# Patient Record
Sex: Female | Born: 1960 | Race: White | Hispanic: No | Marital: Married | Smoking: Former smoker
Health system: Southern US, Community
[De-identification: ages and names within clinical notes are randomized; demographics above are authoritative.]

## PROBLEM LIST (undated history)

## (undated) DIAGNOSIS — K219 Gastro-esophageal reflux disease without esophagitis: Secondary | ICD-10-CM

## (undated) DIAGNOSIS — D649 Anemia, unspecified: Secondary | ICD-10-CM

## (undated) DIAGNOSIS — I499 Cardiac arrhythmia, unspecified: Secondary | ICD-10-CM

## (undated) DIAGNOSIS — F419 Anxiety disorder, unspecified: Secondary | ICD-10-CM

## (undated) DIAGNOSIS — I10 Essential (primary) hypertension: Secondary | ICD-10-CM

## (undated) DIAGNOSIS — M1990 Unspecified osteoarthritis, unspecified site: Secondary | ICD-10-CM

## (undated) DIAGNOSIS — IMO0001 Reserved for inherently not codable concepts without codable children: Secondary | ICD-10-CM

## (undated) DIAGNOSIS — R51 Headache: Secondary | ICD-10-CM

## (undated) DIAGNOSIS — R011 Cardiac murmur, unspecified: Secondary | ICD-10-CM

## (undated) DIAGNOSIS — J189 Pneumonia, unspecified organism: Secondary | ICD-10-CM

## (undated) DIAGNOSIS — K589 Irritable bowel syndrome without diarrhea: Secondary | ICD-10-CM

## (undated) DIAGNOSIS — I209 Angina pectoris, unspecified: Secondary | ICD-10-CM

## (undated) DIAGNOSIS — Z5189 Encounter for other specified aftercare: Secondary | ICD-10-CM

## (undated) DIAGNOSIS — E7800 Pure hypercholesterolemia, unspecified: Secondary | ICD-10-CM

## (undated) HISTORY — DX: Pure hypercholesterolemia, unspecified: E78.00

## (undated) HISTORY — PX: FOOT ARTHROPLASTY: SHX1657

## (undated) HISTORY — PX: CHOLECYSTECTOMY: SHX55

## (undated) HISTORY — PX: OTHER SURGICAL HISTORY: SHX169

## (undated) HISTORY — PX: BACK SURGERY: SHX140

## (undated) HISTORY — PX: APPENDECTOMY: SHX54

## (undated) HISTORY — PX: ABDOMINAL HYSTERECTOMY: SHX81

---

## 2002-06-03 ENCOUNTER — Encounter: Payer: Self-pay | Admitting: Neurosurgery

## 2002-06-03 ENCOUNTER — Observation Stay (HOSPITAL_COMMUNITY): Admission: RE | Admit: 2002-06-03 | Discharge: 2002-06-04 | Payer: Self-pay | Admitting: Neurosurgery

## 2003-08-12 ENCOUNTER — Ambulatory Visit (HOSPITAL_BASED_OUTPATIENT_CLINIC_OR_DEPARTMENT_OTHER): Admission: RE | Admit: 2003-08-12 | Discharge: 2003-08-12 | Payer: Self-pay | Admitting: Orthopedic Surgery

## 2003-08-12 ENCOUNTER — Ambulatory Visit (HOSPITAL_COMMUNITY): Admission: RE | Admit: 2003-08-12 | Discharge: 2003-08-12 | Payer: Self-pay | Admitting: Orthopedic Surgery

## 2007-05-17 ENCOUNTER — Ambulatory Visit: Payer: Self-pay | Admitting: Cardiology

## 2011-02-22 ENCOUNTER — Encounter (HOSPITAL_COMMUNITY)
Admission: AD | Disposition: A | Discharge status: Home / Self Care | Payer: Self-pay | Source: Other Acute Inpatient Hospital | Attending: Cardiovascular Disease

## 2011-02-22 ENCOUNTER — Observation Stay (HOSPITAL_COMMUNITY)
Admission: AD | Admit: 2011-02-22 | Discharge: 2011-02-23 | DRG: 125 | Disposition: A | Discharge status: Home / Self Care | Payer: BC Managed Care – PPO | Source: Other Acute Inpatient Hospital | Attending: Cardiovascular Disease | Admitting: Cardiovascular Disease

## 2011-02-22 ENCOUNTER — Encounter: Payer: Self-pay | Admitting: Cardiology

## 2011-02-22 ENCOUNTER — Inpatient Hospital Stay (HOSPITAL_COMMUNITY): Payer: BC Managed Care – PPO

## 2011-02-22 ENCOUNTER — Encounter (HOSPITAL_COMMUNITY): Payer: Self-pay | Admitting: General Practice

## 2011-02-22 DIAGNOSIS — R0609 Other forms of dyspnea: Secondary | ICD-10-CM | POA: Insufficient documentation

## 2011-02-22 DIAGNOSIS — R0600 Dyspnea, unspecified: Secondary | ICD-10-CM

## 2011-02-22 DIAGNOSIS — I359 Nonrheumatic aortic valve disorder, unspecified: Secondary | ICD-10-CM | POA: Insufficient documentation

## 2011-02-22 DIAGNOSIS — R079 Chest pain, unspecified: Secondary | ICD-10-CM

## 2011-02-22 DIAGNOSIS — R0789 Other chest pain: Principal | ICD-10-CM | POA: Insufficient documentation

## 2011-02-22 DIAGNOSIS — R0989 Other specified symptoms and signs involving the circulatory and respiratory systems: Secondary | ICD-10-CM | POA: Insufficient documentation

## 2011-02-22 DIAGNOSIS — I10 Essential (primary) hypertension: Secondary | ICD-10-CM | POA: Diagnosis present

## 2011-02-22 HISTORY — DX: Cardiac arrhythmia, unspecified: I49.9

## 2011-02-22 HISTORY — DX: Pneumonia, unspecified organism: J18.9

## 2011-02-22 HISTORY — PX: LEFT HEART CATHETERIZATION WITH CORONARY ANGIOGRAM: SHX5451

## 2011-02-22 HISTORY — DX: Anemia, unspecified: D64.9

## 2011-02-22 HISTORY — DX: Gastro-esophageal reflux disease without esophagitis: K21.9

## 2011-02-22 HISTORY — DX: Headache: R51

## 2011-02-22 HISTORY — DX: Irritable bowel syndrome, unspecified: K58.9

## 2011-02-22 HISTORY — DX: Encounter for other specified aftercare: Z51.89

## 2011-02-22 HISTORY — DX: Unspecified osteoarthritis, unspecified site: M19.90

## 2011-02-22 HISTORY — DX: Essential (primary) hypertension: I10

## 2011-02-22 HISTORY — PX: CARDIAC CATHETERIZATION: SHX172

## 2011-02-22 HISTORY — DX: Angina pectoris, unspecified: I20.9

## 2011-02-22 HISTORY — DX: Reserved for inherently not codable concepts without codable children: IMO0001

## 2011-02-22 HISTORY — DX: Cardiac murmur, unspecified: R01.1

## 2011-02-22 HISTORY — DX: Anxiety disorder, unspecified: F41.9

## 2011-02-22 SURGERY — LEFT HEART CATHETERIZATION WITH CORONARY ANGIOGRAM
Anesthesia: LOCAL

## 2011-02-22 MED ORDER — FENTANYL CITRATE 0.05 MG/ML IJ SOLN
INTRAMUSCULAR | Status: AC
  Filled 2011-02-22: qty 2

## 2011-02-22 MED ORDER — ASPIRIN 81 MG PO CHEW
81.0000 mg | CHEWABLE_TABLET | Freq: Every day | ORAL | Status: DC
  Filled 2011-02-22: qty 1

## 2011-02-22 MED ORDER — VERAPAMIL HCL 2.5 MG/ML IV SOLN
INTRAVENOUS | Status: AC
  Filled 2011-02-22: qty 2

## 2011-02-22 MED ORDER — HEPARIN (PORCINE) IN NACL 2-0.9 UNIT/ML-% IJ SOLN
INTRAMUSCULAR | Status: AC
  Filled 2011-02-22: qty 2000

## 2011-02-22 MED ORDER — ONDANSETRON HCL 4 MG/2ML IJ SOLN: 4.0000 mg | Freq: Four times a day (QID) | INTRAMUSCULAR | Status: DC | PRN

## 2011-02-22 MED ORDER — ZOLPIDEM TARTRATE 10 MG PO TABS: 10.0000 mg | ORAL_TABLET | Freq: Every evening | ORAL | Status: DC | PRN

## 2011-02-22 MED ORDER — NITROGLYCERIN 0.2 MG/ML ON CALL CATH LAB
INTRAVENOUS | Status: AC
  Filled 2011-02-22: qty 1

## 2011-02-22 MED ORDER — SODIUM CHLORIDE 0.9 % IV SOLN: INTRAVENOUS | Status: AC

## 2011-02-22 MED ORDER — MIDAZOLAM HCL 2 MG/2ML IJ SOLN
INTRAMUSCULAR | Status: AC
  Filled 2011-02-22: qty 2

## 2011-02-22 MED ORDER — HEPARIN SODIUM (PORCINE) 1000 UNIT/ML IJ SOLN
INTRAMUSCULAR | Status: AC
  Filled 2011-02-22: qty 1

## 2011-02-22 MED ORDER — LISINOPRIL 20 MG PO TABS
20.0000 mg | ORAL_TABLET | Freq: Every day | ORAL | Status: DC
  Filled 2011-02-22 (×2): qty 1

## 2011-02-22 MED ORDER — MORPHINE SULFATE 2 MG/ML IJ SOLN: 2.0000 mg | INTRAMUSCULAR | Status: DC | PRN

## 2011-02-22 MED ORDER — ACETAMINOPHEN 325 MG PO TABS: 650.0000 mg | ORAL_TABLET | ORAL | Status: DC | PRN

## 2011-02-22 MED ORDER — ALPRAZOLAM 0.25 MG PO TABS: 0.2500 mg | ORAL_TABLET | Freq: Two times a day (BID) | ORAL | Status: DC | PRN

## 2011-02-22 MED ORDER — OXYCODONE-ACETAMINOPHEN 5-325 MG PO TABS: 1.0000 | ORAL_TABLET | ORAL | Status: DC | PRN

## 2011-02-22 MED ORDER — IOHEXOL 300 MG/ML  SOLN
80.0000 mL | Freq: Once | INTRAMUSCULAR | Status: AC | PRN
  Administered 2011-02-22: 80 mL via INTRAVENOUS

## 2011-02-22 NOTE — H&P (Signed)
This patient has a full cardiology H&P from [facility] 02/22/11 (Dr Andee Lineman). She is transferred here today for cardiac cath. Admitted with chest pain. Cardiac enzymes negative.   History reviewed, patient examined, no change in status, stable for surgery.  Sarah Bullock,Sarah Bullock 3:31 PM

## 2011-02-22 NOTE — Op Note (Signed)
Cardiac Catheterization Operative Report  Sarah Bullock [id number] 11/6/20124:19 PM Provider Not In System  Procedure Performed:  1. Left Heart Catheterization 2. Selective Coronary Angiography 3. Left ventricular angiogram  Operator: Verne Carrow, MD  Arterial Access Site: Right Radial Artery  Indication: Chest pain                                         Procedure Details: The risks, benefits, complications, treatment options, and expected outcomes were discussed with the patient. The patient concurred with the proposed plan, giving informed consent. The patient was brought to the cath lab after IV hydration was begun and oral premedication was given. The patient was further sedated with fentanyl 25 mcg IV x 1 and Versed 2 mg IV x 1. An Allen's test was performed on the right wrist and was positive. The right wrist was prepped and draped in the usual manner. Using the modified Seldinger access technique, a 5 French sheath was placed in the right radial artery. Standard diagnostic catheters were used to perform selective coronary angiography. A pigtail catheter was used to perform a left ventricular angiogram. There were no immediate complications. The sheath was removed from the wrist and a Terumo hemostasis band was applied to the arteriotomy site.  The patient was taken to the recovery area in stable condition.  Hemodynamic Findings: Central aortic pressure:97/58 Left ventricular pressure:100/8/13  Angiographic Findings:  Left main: No disease  Left Anterior Descending Artery: Large vessel. No disease. Large diagonal free of disease.  Circumflex Artery: 2 large marginal branches. No disease.   Right Coronary Artery: Moderate sized, non-dominant. No disease.   Left Ventricular Angiogram: LVEF 65%. Normal wall motion. No MR.   Impression: 1. No angiographic evidence of CAD 2. Normal LV function.  3. Non-cardiac chest pain  Recommendations: Will admit to telemetry.  Elevated d-dimer. Will get Chest CTA tonight to exclude Pulmonary Embolism.        Complications:  None; patient tolerated the procedure well.

## 2011-02-23 DIAGNOSIS — I10 Essential (primary) hypertension: Secondary | ICD-10-CM | POA: Diagnosis present

## 2011-02-23 NOTE — Discharge Summary (Signed)
I have personally seen and examined this patient with [name], NP and agree with her plans for discharge. cdm

## 2011-02-23 NOTE — Progress Notes (Addendum)
Patient Name: Sarah Bullock Date of Encounter: @DATEFORMAT @    Active Problems:  Dyspnea  Chest pain    SUBJECTIVE:   Some, mild c/p when she got up to go to the bathroom at about 4am.  Described as dull and shooting through the left scapular area.  Brief, resolved spontaneously.  No c/p since.   OBJECTIVE  Filed Vitals:   02/22/11 2141 02/23/11 0031 02/23/11 0300 02/23/11 0328  BP: 95/58 101/58  106/53  Pulse: 80 71  86  Temp: 98.2 F (36.8 C) 98 F (36.7 C)  98.7 F (37.1 C)  TempSrc: Oral Oral  Oral  Resp: 17 14  18   Height:   5\' 2"  (1.575 m)   Weight:   210 lb (95.255 kg)   SpO2: 98% 96%  97%    Intake/Output Summary (Last 24 hours) at 02/23/11 0651 Last data filed at 02/23/11 0300  Gross per 24 hour  Intake    575 ml  Output      1 ml  Net    574 ml    PHYSICAL EXAM  General: Well developed, well nourished, in no acute distress. Head: Normocephalic, atraumatic, sclera non-icteric, no xanthomas, nares are without discharge.  Neck: Supple without bruits or JVD. Lungs:  Resp regular and unlabored, CTA. Heart: RRR no s3, s4, or murmurs. Abdomen: Soft, non-tender, non-distended, BS + x 4.  Msk:  Strength and tone appears normal for age. Extremities: No clubbing, cyanosis or edema. DP/PT/Radials 2+ and equal bilaterally.  R radial cath site w/o bleeding, bruit, or hematoma. Neuro: Alert and oriented X 3. Moves all extremities spontaneously. Psych: Normal affect.  LABS:  TELE - RSR    Radiology/Studies:  Ct Angio Chest W/cm &/or Wo Cm  02/22/2011  *RADIOLOGY REPORT*  Clinical Data:  Chest pain, elevated D-dimer, normal cardiac catheterization.  CT ANGIOGRAPHY CHEST WITH CONTRAST  Technique:  Multidetector CT imaging of the chest was performed using the standard protocol during bolus administration of intravenous contrast.  Multiplanar CT image reconstructions including MIPs were obtained to evaluate the vascular anatomy.  Contrast: 80mL OMNIPAQUE IOHEXOL 300  MG/ML IV SOLN  Comparison:  None  Findings:  There is no pulmonary arterial branch filling defect. The aorta is of normal course and caliber.  Mild scattered atherosclerotic calcification.  Mild aortic valve calcification. Heart size within normal limits.  No pleural or pericardial effusion.  No intrathoracic lymphadenopathy.  Limited images through the upper abdomen demonstrate no acute abnormality.  Status post cholecystectomy.  The central airways are patent and no imaged in expiration. Respiratory motion degrades detailed parenchymal evaluation.  No focal consolidation or pneumothorax identified.  No acute osseous abnormality.  Mild multilevel degenerative changes.  Review of the MIP images confirms the above findings.  IMPRESSION: No pulmonary embolism or acute intrathoracic process identified.  Original Report Authenticated By: Waneta Martins, M.D.   Cardiac Cath Angiographic Findings:  Left main: No disease  Left Anterior Descending Artery: Large vessel. No disease. Large diagonal free of disease.  Circumflex Artery: 2 large marginal branches. No disease.  Right Coronary Artery: Moderate sized, non-dominant. No disease.  Left Ventricular Angiogram: LVEF 65%. Normal wall motion. No MR.       ASSESSMENT AND PLAN: Chest Pain:  No obj evidence of ischemia.  NL Cath and no evidence of PE on CT.  ? MSK source of pain.  Plan d/c today.  HTN:  Stable, cont lisinopril/hctz.    Signed, [name] NP   I  have personally seen and examined this patient with [name], NP. I agree with his assessment and plan. Her cardiac cath showed normal coronary arteries. Her CTA chest showed no evidence of PE or aortic dissection. Her chest pain is felt to be non-cardiac. Will discharge home today without further cardiac workup. Will arrange follow up in the [facility] Cardiology office with Dr. Andee Lineman.

## 2011-02-23 NOTE — Discharge Summary (Signed)
Patient ID: Sarah Bullock,  MRN: [id number], DOB/AGE: May 04, 1960 50 y.o.  Admit date: 02/22/2011 Discharge date: 02/23/2011  Discharge Diagnoses Principal Problem:  *Chest pain Active Problems:  HTN (hypertension)  Dyspnea   Allergies No Known Allergies  Procedures  Ct Angio Chest W/cm &/or Wo Cm  02/22/2011 *RADIOLOGY REPORT* Clinical Data: Chest pain, elevated D-dimer, normal cardiac catheterization. CT ANGIOGRAPHY CHEST WITH CONTRAST Technique: Multidetector CT imaging of the chest was performed using the standard protocol during bolus administration of intravenous contrast. Multiplanar CT image reconstructions including MIPs were obtained to evaluate the vascular anatomy. Contrast: 80mL OMNIPAQUE IOHEXOL 300 MG/ML IV SOLN Comparison: None Findings: There is no pulmonary arterial branch filling defect. The aorta is of normal course and caliber. Mild scattered atherosclerotic calcification. Mild aortic valve calcification. Heart size within normal limits. No pleural or pericardial effusion. No intrathoracic lymphadenopathy. Limited images through the upper abdomen demonstrate no acute abnormality. Status post cholecystectomy. The central airways are patent and no imaged in expiration. Respiratory motion degrades detailed parenchymal evaluation. No focal consolidation or pneumothorax identified. No acute osseous abnormality. Mild multilevel degenerative changes. Review of the MIP images confirms the above findings. IMPRESSION: No pulmonary embolism or acute intrathoracic process identified. Original Report Authenticated By: Waneta Martins, M.D.   Cardiac Cath  Angiographic Findings:  Left main: No disease  Left Anterior Descending Artery: Large vessel. No disease. Large diagonal free of disease.  Circumflex Artery: 2 large marginal branches. No disease.  Right Coronary Artery: Moderate sized, non-dominant. No disease.  Left Ventricular Angiogram: LVEF 65%. Normal wall motion. No MR.     History of Present Illness 50 year old female no prior cardiac history the present morning hospital on 02/22/2011 with complaints of left-sided chest discomfort radiating through to her back associated with dyspnea nausea and diaphoresis. Patient's ECG was essentially normal and cardiac enzymes were negative. She was evaluated by lumbar cardiology and median incision was made to transfer to [facility] for further evaluation and catheterization.   Hospital Course  The patient continued to have intermittent left-sided chest discomfort similar to what prompted her presentation to [facility].  She underwent diagnostic catheterization on November 6 showing normal coronary arteries and normal the function. Patient had a d-dimer drawn at [facility] which were subsequently found to be elevated and therefore she underwent CT angiography of the chest which showed no evidence of pulmonary embolus or other acute intrathoracic process. Patient has been stable post catheterization done without difficulty. We plan to discharge to home today in the condition.  Discharge Vitals:  Blood pressure 106/53, pulse 86, temperature 98.7 F (37.1 C), temperature source Oral, resp. rate 18, height 5\' 2"  (1.575 m), weight 210 lb (95.255 kg), SpO2 97.00%.   Labs:  NL cardiac enzymes, cbc, bmet, coags from [facility] hospital Disposition:   Follow-up Information    Please follow up. (Follow up with Dr. Reuel Boom in 1-2 wks.)        Follow up with Dr. Andee Lineman - [phone] - in 2-3 weeks as needed.  Discharge Medications:  Current Discharge Medication List    CONTINUE these medications which have NOT CHANGED   Details  lisinopril-hydrochlorothiazide (PRINZIDE,ZESTORETIC) 20-25 MG per tablet Take 1 tablet by mouth daily.         Outstanding Labs/Studies:  NONE  Duration of Discharge Encounter: Greater than 30 minutes including physician time.  Signed, [name] NP 02/23/2011, 7:10 AM

## 2011-02-25 ENCOUNTER — Encounter (HOSPITAL_COMMUNITY): Payer: Self-pay

## 2011-04-06 ENCOUNTER — Encounter: Payer: Self-pay | Admitting: *Deleted

## 2011-04-08 ENCOUNTER — Encounter: Payer: BC Managed Care – PPO | Admitting: Cardiovascular Disease

## 2011-05-02 ENCOUNTER — Ambulatory Visit (INDEPENDENT_AMBULATORY_CARE_PROVIDER_SITE_OTHER): Payer: BC Managed Care – PPO | Admitting: Cardiovascular Disease

## 2011-05-02 ENCOUNTER — Encounter: Payer: Self-pay | Admitting: Cardiovascular Disease

## 2011-05-02 DIAGNOSIS — I10 Essential (primary) hypertension: Secondary | ICD-10-CM

## 2011-05-02 DIAGNOSIS — R079 Chest pain, unspecified: Secondary | ICD-10-CM

## 2011-05-02 NOTE — Assessment & Plan Note (Signed)
Blood pressure is well controlled. Continue current medications. 

## 2011-05-02 NOTE — Assessment & Plan Note (Signed)
Cardiac catheterization showed no significant coronary artery disease. CTA of the chest showed no evidence of pulmonary embolism. No further cardiac testing is needed. Her symptoms were likely related to stress. Followup with [place] as needed.

## 2011-05-02 NOTE — Patient Instructions (Signed)
   Follow up as needed. Your physician recommends that you continue on your current medications as directed. Please refer to the Current Medication list given to you today. 

## 2011-05-02 NOTE — Progress Notes (Signed)
HPI  This is a 51 year old female who is here today for a followup visit. She presented to [facility] in November with chest pain. Her cardiac enzymes were negative. D-dimer was mildly elevated. She had previous evaluation for chest pain with negative stress testing. Due to her recurrent chest pain, she was transferred to [facility] where she underwent cardiac catheterization. The catheterization showed no significant coronary artery disease. CTA of the chest showed no evidence of pulmonary embolism. She has not had any significant chest pain since then. It appears that most of the episode was related to stress and anxiety. She works a full-time job at [employer] and takes care of 2 teenage daughters.  No Known Allergies   Current Outpatient Prescriptions on File Prior to Visit  Medication Sig Dispense Refill  . lisinopril-hydrochlorothiazide (PRINZIDE,ZESTORETIC) 20-25 MG per tablet Take 1 tablet by mouth daily.          Past Medical History  Diagnosis Date  . Angina   . Heart murmur     "when I was real little"  . Shortness of breath     pt denies h/o SOB  . Hypertension   . Blood transfusion     "in the 1980's during OR"  . GERD (gastroesophageal reflux disease)   . Headache   . Arthritis   . Anxiety     "they've said I've had some anxiety attacks"  . Pneumonia     "when I was little"  . Anemia   . Spastic colon   . Dysrhythmia     irregular     Past Surgical History  Procedure Date  . Appendectomy [id number]  . Abdominal hysterectomy ~ 2002  . Back surgery ~ 2004    "blown disc"  . Cholecystectomy ~ 1991  . Foot arthroplasty ~ 2004    "put rod in left foot"  . Cesarean section   . Cardiac catheterization 02/22/11    no significant CAD     Family History  Problem Relation Age of Onset  . Coronary artery disease Father     s/p CABG  . Colon cancer Mother   . Cancer Father     prostate cancer  . Hypertension Mother      History    Social History  . Marital Status: Married    Spouse Name: N/A    Number of Children: N/A  . Years of Education: N/A   Occupational History  .      [employer] Department   Social History Main Topics  . Smoking status: Former Smoker -- 1.0 packs/day for 20 years    Types: Cigarettes    Quit date: 03/16/2008  . Smokeless tobacco: Never Used  . Alcohol Use: Not on file     'used to have wine cooler once in awhile; last time ~ Spring 2012"  . Drug Use: No  . Sexually Active: Not on file   Other Topics Concern  . Not on file   Social History Narrative  . No narrative on file     PHYSICAL EXAM   BP 109/70  Pulse 74  Ht 5\' 3"  (1.6 m)  Wt 213 lb (96.616 kg)  BMI 37.73 kg/m2  Constitutional: She is oriented to person, place, and time. She appears well-developed and well-nourished. No distress.  HENT: No nasal discharge.  Head: Normocephalic and atraumatic.  Eyes: Pupils are equal, round, and reactive to light. Right eye exhibits no discharge. Left eye exhibits no discharge.  Neck: Normal range of motion. Neck supple. No JVD present. No thyromegaly present.  Cardiovascular: Normal rate, regular rhythm, normal heart sounds and intact distal pulses. Exam reveals no gallop and no friction rub.  No murmur heard.  Pulmonary/Chest: Effort normal and breath sounds normal. No stridor. No respiratory distress. She has no wheezes. She has no rales. She exhibits no tenderness.  Abdominal: Soft. Bowel sounds are normal. She exhibits no distension. There is no tenderness. There is no rebound and no guarding.  Musculoskeletal: Normal range of motion. She exhibits no edema and no tenderness.  Neurological: She is alert and oriented to person, place, and time. Coordination normal.  Skin: Skin is warm and dry. No rash noted. She is not diaphoretic. No erythema. No pallor.  Psychiatric: She has a normal mood and affect. Her behavior is normal. Judgment and thought content normal.  Right  radial pulse is normal with no evidence of hematoma.    ASSESSMENT AND PLAN

## 2012-07-09 ENCOUNTER — Telehealth: Payer: Self-pay

## 2012-07-09 NOTE — Telephone Encounter (Signed)
Pt was referred by Dr. Donzetta Sprung for colonoscopy. She has family hx of colon cancer in her mom. She is scheduled for OV with Gerrit Halls, NP on 07/30/2012 at 9:30 AM.

## 2012-07-30 ENCOUNTER — Ambulatory Visit (INDEPENDENT_AMBULATORY_CARE_PROVIDER_SITE_OTHER): Payer: BC Managed Care – PPO | Admitting: Gastroenterology

## 2012-07-30 ENCOUNTER — Encounter: Payer: Self-pay | Admitting: Gastroenterology

## 2012-07-30 VITALS — BP 126/81 | HR 70 | Temp 97.4°F | Ht 64.0 in | Wt 215.6 lb

## 2012-07-30 DIAGNOSIS — K219 Gastro-esophageal reflux disease without esophagitis: Secondary | ICD-10-CM

## 2012-07-30 DIAGNOSIS — Z1211 Encounter for screening for malignant neoplasm of colon: Secondary | ICD-10-CM | POA: Insufficient documentation

## 2012-07-30 MED ORDER — PEG 3350-KCL-NA BICARB-NACL 420 G PO SOLR: 4000.0000 mL | ORAL | Status: DC

## 2012-07-30 MED ORDER — OMEPRAZOLE 20 MG PO CPDR: 20.0000 mg | DELAYED_RELEASE_CAPSULE | Freq: Every day | ORAL | Status: DC

## 2012-07-30 NOTE — Progress Notes (Signed)
CC PCP 

## 2012-07-30 NOTE — Assessment & Plan Note (Signed)
52 year old female with a positive family history of colon cancer (mother diagnosed age 44); her last colonoscopy was at least 5 years ago by Dr. Gabriel Cirri. Operative reports unavailable at time of consultation. She denies rectal bleeding, and she reports a history of IBS. Notes occasional loose stools secondary to food such as [place]. No concerning signs currently.  Proceed with colonoscopy with Dr. Darrick Penna in the near future. The risks, benefits, and alternatives have been discussed in detail with the patient. They state understanding and desire to proceed.

## 2012-07-30 NOTE — Assessment & Plan Note (Signed)
Intermittent in the setting of recent weight gain. Likely multifactorial. No dysphagia. Start Prilosec daily for 1 month, follow GERD diet.

## 2012-07-30 NOTE — Patient Instructions (Addendum)
Start taking Prilosec each morning on an empty stomach. We have provided samples and sent this to your pharmacy. Take for 30 days, then see how your symptoms are. Review the reflux diet provided. Keep up the good work!  We have set you up for a colonoscopy with Dr. Darrick Penna in the near future.

## 2012-07-30 NOTE — Progress Notes (Signed)
Referring Provider: Donzetta Sprung, MD Primary Care Physician:  Donzetta Sprung, MD Primary Gastroenterologist:  Dr. Darrick Penna   Chief Complaint  Patient presents with  . Colonoscopy    HPI:   Sarah Bullock is a 52 year old female presenting today for a colonoscopy due to family history of colon cancer. Her mom was diagnosed with colon cancer at age 70. Sarah Bullock last colonoscopy was around 2007-2009 with Dr. Gabriel Cirri. Denies abdominal pain, but she does report a history of IBS. Notes somewhat looser stools than her baseline occasionally, with a BM around once a day. Sometimes diarrhea just out of the blue. Thinks it is food related, usually [place] food. No rectal bleeding.   Last few months rare episodes of nausea, resolving on own. Notes increase in reflux symptoms lately in the presence of weight gain. Not currently on a PPI. Episodes usually once every few days. No dysphagia.   Past Medical History  Diagnosis Date  . Angina   . Heart murmur     "when I was real little"  . Shortness of breath     pt denies h/o SOB  . Hypertension   . Blood transfusion     "in the 1980's during OR"  . GERD (gastroesophageal reflux disease)   . Headache   . Arthritis   . Anxiety     "they've said I've had some anxiety attacks"  . Pneumonia     "when I was little"  . Anemia   . Spastic colon   . Dysrhythmia     irregular    Past Surgical History  Procedure Laterality Date  . Appendectomy  [id number]  . Abdominal hysterectomy  ~ 2002  . Back surgery  ~ 2004    "blown disc"  . Cholecystectomy  ~ 1991  . Foot arthroplasty  ~ 2004    "put rod in left foot"  . Cesarean section    . Cardiac catheterization  02/22/11    no significant CAD    Current Outpatient Prescriptions  Medication Sig Dispense Refill  . lisinopril-hydrochlorothiazide (PRINZIDE,ZESTORETIC) 20-25 MG per tablet Take 1 tablet by mouth daily.        No current facility-administered medications for this visit.    Allergies as of  07/30/2012  . (No Known Allergies)    Family History  Problem Relation Age of Onset  . Coronary artery disease Father     s/p CABG  . Colon cancer Mother   . Cancer Father     prostate cancer  . Hypertension Mother     History   Social History  . Marital Status: Married    Spouse Name: N/A    Number of Children: N/A  . Years of Education: N/A   Occupational History  .      [employer]   Social History Main Topics  . Smoking status: Former Smoker -- 1.00 packs/day for 20 years    Types: Cigarettes    Quit date: 03/16/2008  . Smokeless tobacco: Never Used  . Alcohol Use: No     Comment: 'used to have wine cooler once in awhile; last time ~ Spring 2012"  . Drug Use: No  . Sexually Active: Not on file   Other Topics Concern  . Not on file   Social History Narrative  . No narrative on file    Review of Systems: Gen: see HPI CV: Denies chest pain, heart palpitations, syncope, peripheral edema. Resp: Denies shortness of breath with rest, cough, wheezing GI: SEE  HPI GU : Denies urinary burning, urinary frequency, urinary incontinence.  MS: +joint pain Derm: Denies rash, itching, dry skin Psych: Denies depression, anxiety, confusion or memory loss  Heme: Denies bruising, bleeding, and enlarged lymph nodes.  Physical Exam: BP 126/81  Pulse 70  Temp(Src) 97.4 F (36.3 C) (Oral)  Ht 5\' 4"  (1.626 m)  Wt 215 lb 9.6 oz (97.796 kg)  BMI 36.99 kg/m2 General:   Alert and oriented. Well-developed, well-nourished, pleasant and cooperative. Head:  Normocephalic and atraumatic. Eyes:  Conjunctiva pink, sclera clear, no icterus.   Ears:  Normal auditory acuity. Nose:  No deformity, discharge,  or lesions. Mouth:  No deformity or lesions, mucosa pink and moist.  Neck:  Supple, without mass or thyromegaly. Lungs:  Clear to auscultation bilaterally, without wheezing, rales, or rhonchi.  Heart:  S1, S2 present without murmurs noted.  Abdomen:  +BS, soft,  non-tender and non-distended. Without mass or HSM. No rebound or guarding. No hernias noted. Rectal:  Deferred  Msk:  Symmetrical without gross deformities. Normal posture. Extremities:  Without clubbing or edema. Neurologic:  Alert and  oriented x4;  grossly normal neurologically. Skin:  Intact, warm and dry without significant lesions or rashes Cervical Nodes:  No significant cervical adenopathy. Psych:  Alert and cooperative. Normal mood and affect.

## 2012-08-09 ENCOUNTER — Encounter (HOSPITAL_COMMUNITY): Payer: Self-pay | Admitting: Pharmacy Technician

## 2012-08-13 ENCOUNTER — Encounter (HOSPITAL_COMMUNITY): Payer: Self-pay | Admitting: *Deleted

## 2012-08-13 ENCOUNTER — Ambulatory Visit (HOSPITAL_COMMUNITY)
Admission: RE | Admit: 2012-08-13 | Discharge: 2012-08-13 | Disposition: A | Discharge status: Home / Self Care | Payer: BC Managed Care – PPO | Source: Ambulatory Visit | Attending: Gastroenterology | Admitting: Gastroenterology

## 2012-08-13 ENCOUNTER — Encounter (HOSPITAL_COMMUNITY)
Admission: RE | Disposition: A | Discharge status: Home / Self Care | Payer: Self-pay | Source: Ambulatory Visit | Attending: Gastroenterology

## 2012-08-13 DIAGNOSIS — K219 Gastro-esophageal reflux disease without esophagitis: Secondary | ICD-10-CM | POA: Insufficient documentation

## 2012-08-13 DIAGNOSIS — Z1211 Encounter for screening for malignant neoplasm of colon: Secondary | ICD-10-CM

## 2012-08-13 DIAGNOSIS — Z87891 Personal history of nicotine dependence: Secondary | ICD-10-CM | POA: Insufficient documentation

## 2012-08-13 DIAGNOSIS — Z800 Family history of malignant neoplasm of digestive organs: Secondary | ICD-10-CM | POA: Insufficient documentation

## 2012-08-13 DIAGNOSIS — K621 Rectal polyp: Secondary | ICD-10-CM

## 2012-08-13 DIAGNOSIS — E7800 Pure hypercholesterolemia, unspecified: Secondary | ICD-10-CM | POA: Insufficient documentation

## 2012-08-13 DIAGNOSIS — K620 Anal polyp: Secondary | ICD-10-CM | POA: Insufficient documentation

## 2012-08-13 DIAGNOSIS — D126 Benign neoplasm of colon, unspecified: Secondary | ICD-10-CM | POA: Insufficient documentation

## 2012-08-13 DIAGNOSIS — K589 Irritable bowel syndrome without diarrhea: Secondary | ICD-10-CM | POA: Insufficient documentation

## 2012-08-13 DIAGNOSIS — D649 Anemia, unspecified: Secondary | ICD-10-CM | POA: Insufficient documentation

## 2012-08-13 DIAGNOSIS — I10 Essential (primary) hypertension: Secondary | ICD-10-CM | POA: Insufficient documentation

## 2012-08-13 DIAGNOSIS — K648 Other hemorrhoids: Secondary | ICD-10-CM | POA: Insufficient documentation

## 2012-08-13 DIAGNOSIS — Z8042 Family history of malignant neoplasm of prostate: Secondary | ICD-10-CM | POA: Insufficient documentation

## 2012-08-13 HISTORY — PX: COLONOSCOPY: SHX5424

## 2012-08-13 SURGERY — COLONOSCOPY
Anesthesia: Moderate Sedation

## 2012-08-13 MED ORDER — MEPERIDINE HCL 50 MG/ML IJ SOLN
INTRAMUSCULAR | Status: AC
  Filled 2012-08-13: qty 1

## 2012-08-13 MED ORDER — MIDAZOLAM HCL 5 MG/5ML IJ SOLN
INTRAMUSCULAR | Status: DC | PRN
  Administered 2012-08-13 (×3): 1 mg via INTRAVENOUS
  Administered 2012-08-13: 2 mg via INTRAVENOUS

## 2012-08-13 MED ORDER — STERILE WATER FOR IRRIGATION IR SOLN
Status: DC | PRN
  Administered 2012-08-13: 09:00:00

## 2012-08-13 MED ORDER — MIDAZOLAM HCL 5 MG/5ML IJ SOLN
INTRAMUSCULAR | Status: AC
  Filled 2012-08-13: qty 10

## 2012-08-13 MED ORDER — SODIUM CHLORIDE 0.9 % IV SOLN
INTRAVENOUS | Status: DC
Start: 2012-08-13 — End: 2012-08-13
  Administered 2012-08-13: 08:00:00 via INTRAVENOUS

## 2012-08-13 MED ORDER — MEPERIDINE HCL 100 MG/ML IJ SOLN
INTRAMUSCULAR | Status: DC | PRN
  Administered 2012-08-13 (×2): 25 mg via INTRAVENOUS

## 2012-08-13 MED ORDER — MEPERIDINE HCL 100 MG/ML IJ SOLN
INTRAMUSCULAR | Status: AC
  Filled 2012-08-13: qty 1

## 2012-08-13 NOTE — Op Note (Addendum)
[facility] [address] [place], [address]   COLONOSCOPY PROCEDURE REPORT  PATIENT: Sarah Bullock, Sarah Bullock  MR#: [id number] BIRTHDATE: January 14, 1961 , 52  yrs. old GENDER: Female ENDOSCOPIST: Jonette Eva, MD REFERRED AO:ZHYQM Reuel Boom, M.D. PROCEDURE DATE:  08/13/2012 PROCEDURE:   Colonoscopy with cold biopsy polypectomy INDICATIONS:Average risk patient for colon cancer. MOTHER HAD COLON CANCER AFTER AGE 53. MEDICATIONS: Demerol 50 mg IV and Versed 5 mg IV  DESCRIPTION OF PROCEDURE:    Physical exam was performed.  Informed consent was obtained from the patient after explaining the benefits, risks, and alternatives to procedure.  The patient was connected to monitor and placed in left lateral position. Continuous oxygen was provided by nasal cannula and IV medicine administered through an indwelling cannula.  After administration of sedation and rectal exam, the patients rectum was intubated and the EC-[id number] ([id number])  colonoscope was advanced under direct visualization to the ileum.  The scope was removed slowly by carefully examining the color, texture, anatomy, and integrity mucosa on the way out.  The patient was recovered in endoscopy and discharged home in satisfactory condition.    COLON FINDINGS: Two sessile polyps measuring 3-4 mm in size were found in the ascending colon.  A polypectomy was performed with cold forceps.  , Three sessile polyps measuring 2-3 mm in size were found in the rectum.  A polypectomy was performed with cold forceps.  , Small internal hemorrhoids were found.  , and The colon was otherwise normal.  There was no diverticulosis, inflammation, or cancers unless previously stated.  PREP QUALITY: excellent. CECAL W/D TIME: 14 minutes  COMPLICATIONS: None  ENDOSCOPIC IMPRESSION: 1.   FIVE SMALL polyps 2.   Small internal hemorrhoids   RECOMMENDATIONS: START A WEIGHT LOSS PROGRAM.  OBESITY IS ASSOCIATED WITH AN INCREASED RISK OF  COLON CANCER. FOLLOW A HIGH FIBER DIET.  AVOID ITEMS THAT CAUSE BLOATING & GAS. BIOPSY RESULTS SHOULD BE BACK IN 7 DAYS. Next colonoscopy in 10 years BECAUSE YOUR MOTHER HAD COLON CANCER AFTER AGE 53.       _______________________________ Rosalie DoctorJonette Eva, MD 08/13/2012 9:47 AM Revised: 08/13/2012 9:47 AM

## 2012-08-13 NOTE — H&P (Signed)
  Primary Care Physician:  Donzetta Sprung, MD Primary Gastroenterologist:  Dr. Darrick Penna  Pre-Procedure History & Physical: HPI:  Sarah Bullock is a 52 y.o. female here for COLON CANCER SCREENING.   Past Medical History  Diagnosis Date  . Angina   . Heart murmur     "when I was real little"  . Hypertension   . Blood transfusion     "in the 1980's during OR"  . GERD (gastroesophageal reflux disease)   . Headache   . Arthritis   . Anxiety     "they've said I've had some anxiety attacks"  . Pneumonia     "when I was little"  . Anemia   . Spastic colon   . Dysrhythmia     irregular  . Hypercholesterolemia     Past Surgical History  Procedure Laterality Date  . Appendectomy  [id number]  . Abdominal hysterectomy  ~ 2002  . Back surgery  ~ 2004    "blown disc"  . Cholecystectomy  ~ 1991  . Foot arthroplasty  ~ 2004    "put rod in left foot"  . Cesarean section    . Cardiac catheterization  02/22/11    no significant CAD  . Bladder surgery      Prior to Admission medications   Medication Sig Start Date End Date Taking? Authorizing Provider  lisinopril-hydrochlorothiazide (PRINZIDE,ZESTORETIC) 20-25 MG per tablet Take 1 tablet by mouth daily.    Yes Historical Provider, MD  polyethylene glycol-electrolytes (TRILYTE) 420 G solution Take 4,000 mLs by mouth as directed. 07/30/12  Yes West Bali, MD    Allergies as of 07/30/2012  . (No Known Allergies)    Family History  Problem Relation Age of Onset  . Coronary artery disease Father     s/p CABG  . Colon cancer Mother     age 103  . Cancer Father     prostate cancer  . Hypertension Mother     History   Social History  . Marital Status: Married    Spouse Name: N/A    Number of Children: N/A  . Years of Education: N/A   Occupational History  .      [employer]   Social History Main Topics  . Smoking status: Former Smoker -- 1.00 packs/day for 20 years    Types: Cigarettes    Quit date: 03/16/2008   . Smokeless tobacco: Never Used  . Alcohol Use: No     Comment: rarely  . Drug Use: No  . Sexually Active: Not on file   Other Topics Concern  . Not on file   Social History Narrative  . No narrative on file    Review of Systems: See HPI, otherwise negative ROS   Physical Exam: BP 134/76  Pulse 75  Temp(Src) 97.6 F (36.4 C) (Oral)  Resp 18  Ht 5\' 4"  (1.626 m)  Wt 215 lb (97.523 kg)  BMI 36.89 kg/m2  SpO2 96% General:   Alert,  pleasant and cooperative in NAD Head:  Normocephalic and atraumatic. Neck:  Supple; Lungs:  Clear throughout to auscultation.    Heart:  Regular rate and rhythm. Abdomen:  Soft, nontender and nondistended. Normal bowel sounds, without guarding, and without rebound.   Neurologic:  Alert and  oriented x4;  grossly normal neurologically.  Impression/Plan:     SCREENING  Plan:  1. TCS TODAY

## 2012-08-16 ENCOUNTER — Telehealth: Payer: Self-pay | Admitting: Gastroenterology

## 2012-08-16 ENCOUNTER — Encounter (HOSPITAL_COMMUNITY): Payer: Self-pay | Admitting: Gastroenterology

## 2012-08-16 DIAGNOSIS — R1031 Right lower quadrant pain: Secondary | ICD-10-CM

## 2012-08-16 NOTE — Telephone Encounter (Signed)
Cc PCP 

## 2012-08-16 NOTE — Telephone Encounter (Signed)
Called, left message for pt to return call

## 2012-08-16 NOTE — Telephone Encounter (Signed)
Please call pt. She had simple adenomas removed from her colon.   START A WEIGHT LOSS PROGRAM.   FOLLOW A HIGH FIBER DIET. AVOID ITEMS THAT CAUSE BLOATING & GAS.   Next colonoscopy in 10 years BECAUSE YOUR MOTHER HAD POLYPS AFTER AGE 53.

## 2012-08-20 ENCOUNTER — Other Ambulatory Visit: Payer: Self-pay | Admitting: Gastroenterology

## 2012-08-20 DIAGNOSIS — R1011 Right upper quadrant pain: Secondary | ICD-10-CM

## 2012-08-20 NOTE — Telephone Encounter (Signed)
PLEASE CALL PT. If she is having abdominal pain after a colonoscopy, she should have a CT of her abd and pelvis w/ oral contrast TODAY TO EVALUATE FOR A PERFORATION.

## 2012-08-20 NOTE — Telephone Encounter (Signed)
Called and informed pt. She has some concerns. Said her colonoscopy was a week ago today. She has been having some pain on her right side that radiates to her back. She feels it all time, but when she lifts her right foot to get in the car it throbs and it feels like a twisted muscle. She just wonders if she could have turned wrong or something during the procedure. She remembers waking up before it was over and trying to mumble that she was hurting. Please advise! Her call back number is her cell at [phone].

## 2012-08-20 NOTE — Telephone Encounter (Signed)
Waiting for Ins to approve procedure

## 2012-08-20 NOTE — Addendum Note (Signed)
Addended by: West Bali on: 08/20/2012 09:03 AM   Modules accepted: Orders

## 2012-08-20 NOTE — Telephone Encounter (Signed)
Pt is aware. Routing to Soledad Gerlach to schedule.

## 2012-08-21 ENCOUNTER — Ambulatory Visit (HOSPITAL_COMMUNITY)
Admission: RE | Admit: 2012-08-21 | Discharge: 2012-08-21 | Disposition: A | Discharge status: Home / Self Care | Payer: BC Managed Care – PPO | Source: Ambulatory Visit | Attending: Gastroenterology | Admitting: Gastroenterology

## 2012-08-21 DIAGNOSIS — R1031 Right lower quadrant pain: Secondary | ICD-10-CM | POA: Insufficient documentation

## 2012-08-21 DIAGNOSIS — R933 Abnormal findings on diagnostic imaging of other parts of digestive tract: Secondary | ICD-10-CM | POA: Insufficient documentation

## 2012-08-21 NOTE — Telephone Encounter (Signed)
Patient is scheduled for Wednesday 7th at 8:00 am and she is aware

## 2012-08-22 ENCOUNTER — Ambulatory Visit (HOSPITAL_COMMUNITY): Payer: BC Managed Care – PPO

## 2012-08-22 NOTE — Telephone Encounter (Addendum)
AFTER CT SCAN HAD CHILLS AND WATERY DIARRHEA AND SEVERE CRAMPY ABD PAIN TODAY.  HAD TO LEAVE WORK  DUE TO ABDOMINAL PAIN. FEELING A LITTLE BETTER. PT HAS PAIN MEDS AT HOME. HAS HYDROCODONE AT HOME. REVIEWED SEDATION WITH PT. DIDN'T FEEL LIKE SHE HAD TOO MUCH.  PAIN STARTED ABOUT 4 AM THAT AM. IF SHE MOVES OR SITS,  OR TURNS IT HURTS. STANDING MAKES IT BETTER.  CALL IN 7 DAYS IF SX NOT IMPROVED.

## 2012-09-26 ENCOUNTER — Telehealth: Payer: Self-pay

## 2012-09-26 NOTE — Telephone Encounter (Signed)
Pt left a VM for a return call that she has some questions. I called and LMOM for her to call.

## 2012-10-31 NOTE — Progress Notes (Signed)
TCS APR 2014 SIMPLE ADENOMA, IH  CT ABD/PELVIS MAY 2014 ENTERITIS  REVIEWED.

## 2013-02-21 ENCOUNTER — Other Ambulatory Visit: Payer: Self-pay

## 2014-03-26 ENCOUNTER — Encounter (HOSPITAL_COMMUNITY): Payer: Self-pay | Admitting: Cardiovascular Disease

## 2015-01-06 ENCOUNTER — Emergency Department (HOSPITAL_COMMUNITY)
Admission: EM | Admit: 2015-01-06 | Discharge: 2015-01-06 | Disposition: A | Discharge status: Home / Self Care | Payer: BLUE CROSS/BLUE SHIELD | Attending: Emergency Medicine | Admitting: Emergency Medicine

## 2015-01-06 ENCOUNTER — Encounter (HOSPITAL_COMMUNITY): Payer: Self-pay | Admitting: Emergency Medicine

## 2015-01-06 DIAGNOSIS — J019 Acute sinusitis, unspecified: Secondary | ICD-10-CM | POA: Insufficient documentation

## 2015-01-06 DIAGNOSIS — Z9889 Other specified postprocedural states: Secondary | ICD-10-CM | POA: Diagnosis not present

## 2015-01-06 DIAGNOSIS — I10 Essential (primary) hypertension: Secondary | ICD-10-CM | POA: Insufficient documentation

## 2015-01-06 DIAGNOSIS — M1990 Unspecified osteoarthritis, unspecified site: Secondary | ICD-10-CM | POA: Insufficient documentation

## 2015-01-06 DIAGNOSIS — Z87891 Personal history of nicotine dependence: Secondary | ICD-10-CM | POA: Insufficient documentation

## 2015-01-06 DIAGNOSIS — Z79899 Other long term (current) drug therapy: Secondary | ICD-10-CM | POA: Insufficient documentation

## 2015-01-06 DIAGNOSIS — Z8701 Personal history of pneumonia (recurrent): Secondary | ICD-10-CM | POA: Diagnosis not present

## 2015-01-06 DIAGNOSIS — R42 Dizziness and giddiness: Secondary | ICD-10-CM | POA: Insufficient documentation

## 2015-01-06 DIAGNOSIS — Z791 Long term (current) use of non-steroidal anti-inflammatories (NSAID): Secondary | ICD-10-CM | POA: Diagnosis not present

## 2015-01-06 DIAGNOSIS — Z862 Personal history of diseases of the blood and blood-forming organs and certain disorders involving the immune mechanism: Secondary | ICD-10-CM | POA: Diagnosis not present

## 2015-01-06 DIAGNOSIS — Z8719 Personal history of other diseases of the digestive system: Secondary | ICD-10-CM | POA: Insufficient documentation

## 2015-01-06 DIAGNOSIS — Z8639 Personal history of other endocrine, nutritional and metabolic disease: Secondary | ICD-10-CM | POA: Diagnosis not present

## 2015-01-06 DIAGNOSIS — R011 Cardiac murmur, unspecified: Secondary | ICD-10-CM | POA: Diagnosis not present

## 2015-01-06 LAB — CBC WITH DIFFERENTIAL/PLATELET
BASOS PCT: 0 %
Basophils Absolute: 0 10*3/uL (ref 0.0–0.1)
EOS ABS: 0.1 10*3/uL (ref 0.0–0.7)
Eosinophils Relative: 1 %
HCT: 39.7 % (ref 36.0–46.0)
HEMOGLOBIN: 12.8 g/dL (ref 12.0–15.0)
Lymphocytes Relative: 18 %
Lymphs Abs: 1.5 10*3/uL (ref 0.7–4.0)
MCH: 26.2 pg (ref 26.0–34.0)
MCHC: 32.2 g/dL (ref 30.0–36.0)
MCV: 81.2 fL (ref 78.0–100.0)
Monocytes Absolute: 0.4 10*3/uL (ref 0.1–1.0)
Monocytes Relative: 4 %
NEUTROS PCT: 77 %
Neutro Abs: 6.6 10*3/uL (ref 1.7–7.7)
PLATELETS: 202 10*3/uL (ref 150–400)
RBC: 4.89 MIL/uL (ref 3.87–5.11)
RDW: 14 % (ref 11.5–15.5)
WBC: 8.5 10*3/uL (ref 4.0–10.5)

## 2015-01-06 LAB — BASIC METABOLIC PANEL
Anion gap: 10 (ref 5–15)
BUN: 17 mg/dL (ref 6–20)
CHLORIDE: 104 mmol/L (ref 101–111)
CO2: 27 mmol/L (ref 22–32)
CREATININE: 0.68 mg/dL (ref 0.44–1.00)
Calcium: 8.9 mg/dL (ref 8.9–10.3)
Glucose, Bld: 125 mg/dL — ABNORMAL HIGH (ref 65–99)
POTASSIUM: 3.2 mmol/L — AB (ref 3.5–5.1)
SODIUM: 141 mmol/L (ref 135–145)

## 2015-01-06 LAB — TROPONIN I

## 2015-01-06 MED ORDER — SODIUM CHLORIDE 0.9 % IV SOLN: Freq: Once | INTRAVENOUS | Status: DC

## 2015-01-06 MED ORDER — MECLIZINE HCL 12.5 MG PO TABS
25.0000 mg | ORAL_TABLET | Freq: Once | ORAL | Status: AC
  Administered 2015-01-06: 25 mg via ORAL
  Filled 2015-01-06: qty 2

## 2015-01-06 MED ORDER — SODIUM CHLORIDE 0.9 % IV BOLUS (SEPSIS)
500.0000 mL | Freq: Once | INTRAVENOUS | Status: AC
  Administered 2015-01-06: 500 mL via INTRAVENOUS

## 2015-01-06 MED ORDER — AMOXICILLIN 500 MG PO CAPS: 500.0000 mg | ORAL_CAPSULE | Freq: Three times a day (TID) | ORAL | Status: DC

## 2015-01-06 MED ORDER — ONDANSETRON HCL 4 MG/2ML IJ SOLN
4.0000 mg | Freq: Once | INTRAMUSCULAR | Status: AC
  Administered 2015-01-06: 4 mg via INTRAVENOUS
  Filled 2015-01-06: qty 2

## 2015-01-06 MED ORDER — ONDANSETRON 4 MG PO TBDP: 4.0000 mg | ORAL_TABLET | Freq: Three times a day (TID) | ORAL | Status: DC | PRN

## 2015-01-06 MED ORDER — DIAZEPAM 5 MG/ML IJ SOLN
2.5000 mg | Freq: Once | INTRAMUSCULAR | Status: AC
  Administered 2015-01-06: 2.5 mg via INTRAVENOUS
  Filled 2015-01-06: qty 2

## 2015-01-06 MED ORDER — DIAZEPAM 5 MG PO TABS: 5.0000 mg | ORAL_TABLET | Freq: Two times a day (BID) | ORAL | Status: DC | PRN

## 2015-01-06 MED ORDER — DIPHENHYDRAMINE HCL 50 MG/ML IJ SOLN
25.0000 mg | Freq: Once | INTRAMUSCULAR | Status: AC
  Administered 2015-01-06: 25 mg via INTRAVENOUS
  Filled 2015-01-06: qty 1

## 2015-01-06 MED ORDER — MECLIZINE HCL 25 MG PO TABS: 25.0000 mg | ORAL_TABLET | Freq: Three times a day (TID) | ORAL | Status: DC | PRN

## 2015-01-06 MED ORDER — FLUTICASONE PROPIONATE 50 MCG/ACT NA SUSP: NASAL | Status: DC

## 2015-01-06 NOTE — Discharge Instructions (Signed)
Benign Positional Vertigo Vertigo means you feel like you or your surroundings are moving when they are not. Benign positional vertigo is the most common form of vertigo. Benign means that the cause of your condition is not serious. Benign positional vertigo is more common in older adults. CAUSES  Benign positional vertigo is the result of an upset in the labyrinth system. This is an area in the middle ear that helps control your balance. This may be caused by a viral infection, head injury, or repetitive motion. However, often no specific cause is found. SYMPTOMS  Symptoms of benign positional vertigo occur when you move your head or eyes in different directions. Some of the symptoms may include:  Loss of balance and falls.  Vomiting.  Blurred vision.  Dizziness.  Nausea.  Involuntary eye movements (nystagmus). DIAGNOSIS  Benign positional vertigo is usually diagnosed by physical exam. If the specific cause of your benign positional vertigo is unknown, your caregiver may perform imaging tests, such as magnetic resonance imaging (MRI) or computed tomography (CT). TREATMENT  Your caregiver may recommend movements or procedures to correct the benign positional vertigo. Medicines such as meclizine, benzodiazepines, and medicines for nausea may be used to treat your symptoms. In rare cases, if your symptoms are caused by certain conditions that affect the inner ear, you may need surgery. HOME CARE INSTRUCTIONS   Follow your caregiver's instructions.  Move slowly. Do not make sudden body or head movements.  Avoid driving.  Avoid operating heavy machinery.  Avoid performing any tasks that would be dangerous to you or others during a vertigo episode.  Drink enough fluids to keep your urine clear or pale yellow. SEEK IMMEDIATE MEDICAL CARE IF:   You develop problems with walking, weakness, numbness, or using your arms, hands, or legs.  You have difficulty speaking.  You develop  severe headaches.  Your nausea or vomiting continues or gets worse.  You develop visual changes.  Your family or friends notice any behavioral changes.  Your condition gets worse.  You have a fever.  You develop a stiff neck or sensitivity to light. MAKE SURE YOU:   Understand these instructions.  Will watch your condition.  Will get help right away if you are not doing well or get worse. Document Released: 01/10/2006 Document Revised: 06/27/2011 Document Reviewed: 12/23/2010 ExitCare Patient Information 2015 [employer]. This information is not intended to replace advice given to you by your health care provider. Make sure you discuss any questions you have with your health care provider.    

## 2015-01-06 NOTE — ED Provider Notes (Signed)
CSN: [id number]     Arrival date & time 01/06/15  1150 History  This chart was scribed for Tanna Furry, MD by Terressa Koyanagi, ED Scribe. This patient was seen in room [facility]07/[facility]07 and the patient's care was started at 12:56 PM.   Chief Complaint  Patient presents with  . Dizziness  . Chest Pain   The history is provided by the patient. No language interpreter was used.   PCP: Gar Ponto, MD HPI Comments: Sarah Bullock is a 54 y.o. female, with PMHx noted below including HTN and Left heart catheterization with coronary angiogram, who presents to the Emergency Department complaining of worsening dizziness (room spinning) with associated nausea, diaphoresis, chest tightness ("feels like someone is sitting on my chest") and weakness of BUE onset this morning. Pt denies any Hx of similar Sx. Pt notes she was seen by her PCP yesterday for cold like Sx including sore throat, and head congestion/"fullness"  whereby she was Dx with laryngitis. Pt denies any numbness of the BUE or BLE, ear pain, ear tingling. Pt denies any medicinal allergies.   Past Medical History  Diagnosis Date  . Angina   . Heart murmur     "when I was real little"  . Hypertension   . Blood transfusion     "in the 1980's during OR"  . GERD (gastroesophageal reflux disease)   . Headache(784.0)   . Arthritis   . Anxiety     "they've said I've had some anxiety attacks"  . Pneumonia     "when I was little"  . Anemia   . Spastic colon   . Dysrhythmia     irregular  . Hypercholesterolemia    Past Surgical History  Procedure Laterality Date  . Appendectomy  [id number]  . Abdominal hysterectomy  ~ 2002  . Back surgery  ~ 2004    "blown disc"  . Cholecystectomy  ~ 1991  . Foot arthroplasty  ~ 2004    "put rod in left foot"  . Cesarean section    . Cardiac catheterization  02/22/11    no significant CAD  . Bladder surgery    . Colonoscopy N/A 08/13/2012    Procedure: COLONOSCOPY;  Surgeon: Danie Binder, MD;  Location: [facility]  ENDO SUITE;  Service: Endoscopy;  Laterality: N/A;  8:30-moved to [place] to notify pt  . Left heart catheterization with coronary angiogram N/A 02/22/2011    Procedure: LEFT HEART CATHETERIZATION WITH CORONARY ANGIOGRAM;  Surgeon: Burnell Blanks, MD;  Location: [facility] CATH LAB;  Service: Cardiovascular;  Laterality: N/A;   Family History  Problem Relation Age of Onset  . Coronary artery disease Father     s/p CABG  . Colon cancer Mother     age 36  . Cancer Father     prostate cancer  . Hypertension Mother    Social History  Substance Use Topics  . Smoking status: Former Smoker -- 1.00 packs/day for 20 years    Types: Cigarettes    Quit date: 03/16/2008  . Smokeless tobacco: Never Used  . Alcohol Use: No     Comment: rarely   OB History    No data available     Review of Systems  Constitutional: Negative for fever, chills, diaphoresis, appetite change and fatigue.  HENT: Positive for congestion (head congestion) and sore throat. Negative for ear pain, mouth sores and trouble swallowing.   Eyes: Negative for visual disturbance.  Respiratory: Positive for chest tightness. Negative for cough,  shortness of breath and wheezing.   Cardiovascular: Negative for chest pain.  Gastrointestinal: Negative for nausea, vomiting, abdominal pain, diarrhea and abdominal distention.  Endocrine: Negative for polydipsia, polyphagia and polyuria.  Genitourinary: Negative for dysuria, frequency and hematuria.  Musculoskeletal: Negative for gait problem.  Skin: Negative for color change, pallor and rash.  Neurological: Positive for dizziness and weakness (BUE). Negative for syncope, light-headedness, numbness and headaches.  Hematological: Does not bruise/bleed easily.  Psychiatric/Behavioral: Negative for behavioral problems and confusion.   Allergies  Review of patient's allergies indicates no known allergies.  Home Medications   Prior to Admission medications   Medication Sig  Start Date End Date Taking? Authorizing Provider  Cholecalciferol (VITAMIN D3) 400 UNITS CAPS Take 400 Units by mouth daily.    Yes Historical Provider, MD  ibuprofen (ADVIL,MOTRIN) 200 MG tablet Take 400 mg by mouth every 6 (six) hours as needed.   Yes Historical Provider, MD  lisinopril-hydrochlorothiazide (PRINZIDE,ZESTORETIC) 20-25 MG per tablet Take 1 tablet by mouth daily.    Yes Historical Provider, MD  meloxicam (MOBIC) 7.5 MG tablet Take 7.5 mg by mouth daily.    Yes Historical Provider, MD  amoxicillin (AMOXIL) 500 MG capsule Take 1 capsule (500 mg total) by mouth 3 (three) times daily. 01/06/15   Tanna Furry, MD  diazepam (VALIUM) 5 MG tablet Take 1 tablet (5 mg total) by mouth 3 times/day as needed-between meals & bedtime (VertigotigoVer). 01/06/15   Tanna Furry, MD  fluticasone Asencion Islam) 50 MCG/ACT nasal spray 1 spray each nares twice a day 01/06/15   Tanna Furry, MD  meclizine (ANTIVERT) 25 MG tablet Take 1 tablet (25 mg total) by mouth 3 (three) times daily as needed. 01/06/15   Tanna Furry, MD  ondansetron (ZOFRAN ODT) 4 MG disintegrating tablet Take 1 tablet (4 mg total) by mouth every 8 (eight) hours as needed for nausea. 01/06/15   Tanna Furry, MD   Triage Vitals: BP 150/83 mmHg  Pulse 57  Temp(Src) 98.1 F (36.7 C) (Oral)  Resp 16  Ht 5\' 4"  (1.626 m)  Wt 196 lb (88.905 kg)  BMI 33.63 kg/m2  SpO2 100% Physical Exam  Constitutional: She is oriented to person, place, and time. She appears well-developed and well-nourished. No distress.  HENT:  Head: Normocephalic.  Eyes: Conjunctivae are normal. Pupils are equal, round, and reactive to light. No scleral icterus.  Neck: Normal range of motion. Neck supple. No thyromegaly present.  Cardiovascular: Normal rate and regular rhythm.  Exam reveals no gallop and no friction rub.   No murmur heard. Pulmonary/Chest: Effort normal and breath sounds normal. No respiratory distress. She has no wheezes. She has no rales.  Abdominal: Soft. Bowel  sounds are normal. She exhibits no distension. There is no tenderness. There is no rebound.  Musculoskeletal: Normal range of motion.  Neurological: She is alert and oriented to person, place, and time.  Complains of Sx but no nystagmus when sitting up.   Skin: Skin is warm and dry. No rash noted.  Psychiatric: She has a normal mood and affect. Her behavior is normal.    ED Course  Procedures (including critical care time) DIAGNOSTIC STUDIES: Oxygen Saturation is 100% on RA, nl by my interpretation.    COORDINATION OF CARE: 1:02 PM: Discussed treatment plan which includes meds with pt at bedside; patient verbalizes understanding and agrees with treatment plan.   EKG Interpretation None      MDM   Final diagnoses:  Vertigo  Acute sinusitis, recurrence not specified,  unspecified location    Patient was symptomatic relief after meds. Symptoms sound consistent with an acute upper rest or infection and sinusitis and secondary acute peripheral vertigo. Plan is home, Flonase and antibiotics for her sinuses, meclizine, when necessary Valium, Zofran for vertigo. Vertigo precautions. No driving until 24 hours symptoms. Continue her meclizine until 24 hours without symptoms. ER with acute changes. Routine primary care follow-up.   I personally performed the services described in this documentation, which was scribed in my presence. The recorded information has been reviewed and is accurate.    Tanna Furry, MD 01/06/15 [phone]

## 2015-01-06 NOTE — ED Notes (Signed)
Pt states that she went to her pcp yesterday for cold symptoms/sore throat and was dx with acute laryngitis.  States has been having a lot of fullness in head and tightness in chest.  Was evaluated for this yesterday.  Today became dizzy while at work.  States that the room is spinning.  Closing eyes helps dizziness.

## 2015-06-15 ENCOUNTER — Other Ambulatory Visit: Payer: Self-pay | Admitting: Podiatry

## 2015-06-19 NOTE — Patient Instructions (Addendum)
Sarah Bullock  06/19/2015     @PREFPERIOPPHARMACY @   Your procedure is scheduled on 06/24/2015.  Report to [facility] at 7:00 A.M.  Call this number if you have problems the morning of surgery:  [phone]   Remember:  Do not eat food or drink liquids after midnight.  Take these medicines the morning of surgery with A SIP OF WATER:  Valium, Flonase and Lisinopril   Do not wear jewelry, make-up or nail polish.  Do not wear lotions, powders, or perfumes.  You may wear deodorant.  Do not shave 48 hours prior to surgery.  Men may shave face and neck.  Do not bring valuables to the hospital.  [facility] is not responsible for any belongings or valuables.  Contacts, dentures or bridgework may not be worn into surgery.  Leave your suitcase in the car.  After surgery it may be brought to your room.  For patients admitted to the hospital, discharge time will be determined by your treatment team.  Patients discharged the day of surgery will not be allowed to drive home.   Name and phone number of your driver:   family Special instructions:  n/a  Please read over the following fact sheets that you were given. Care and Recovery After Surgery         Bunionectomy A bunionectomy is a surgical procedure to remove a bunion. A bunion is a visible bump of bone on the inside of your foot where your big toe meets the rest of your foot. A bunion can develop when pressure turns this bone (first metatarsal) toward the other toes. Shoes that are too tight are the most common cause of bunions. Bunions can also be caused by diseases, such as arthritis and polio. You may need a bunionectomy if your bunion is very large and painful or it affects your ability to walk. LET [facility] CARE PROVIDER KNOW ABOUT:  Any allergies you have.  All medicines you are taking, including vitamins, herbs, eye drops, creams, and over-the-counter medicines.  Previous problems you or members of your family have  had with the use of anesthetics.  Any blood disorders you have.  Previous surgeries you have had.  Medical conditions you have. RISKS AND COMPLICATIONS  Generally, this is a safe procedure. However, problems may occur, including:  Infection.  Pain.  Nerve damage.  Bleeding or blood clots.  Reactions to medicines.  Numbness, stiffness, or arthritis in your toe.  Foot problems that continue even after the procedure. BEFORE THE PROCEDURE  Ask your health care provider about:  Changing or stopping your regular medicines. This is especially important if you are taking diabetes medicines or blood thinners.  Taking medicines such as aspirin and ibuprofen. These medicines can thin your blood. Do not take these medicines before your procedure if your health care provider instructs you not to.  Do not drink alcohol before the procedure as directed by your health care provider.  Do not use tobacco products, including cigarettes, chewing tobacco, or electronic cigarettes, before the procedure as directed by your health care provider. If you need help quitting, ask your health care provider.  Ask your health care provider what kind of medicine you will be given during your procedure. A bunionectomy may be done using one of these:  A medicine that numbs the area (local anesthetic).  A medicine that makes you go to sleep (general anesthetic). If you will be given general anesthetic, do not eat or drink  anything after midnight on the night before the procedure or as directed by your health care provider. PROCEDURE  An IV tube may be inserted into a vein.  You will be given local anesthetic or general anesthetic.  The surgeon will make a cut (incision) over the enlarged area at the first joint of the big toe. The surgeon will remove the bunion.  You may have more than one incision if any of the bones in your big toe need to be moved. A bone itself may need to be cut.  Sometimes the  tissues around the big toe may also need to be cut then tightened or loosened to reposition the toe.  Screws or other hardware may be used to keep your foot in thecorrect position.  The incision will be closed with stitches (sutures) and covered with adhesive strips or another type of bandage (dressing). AFTER THE PROCEDURE  You may spend some time in a recovery area.  Your blood pressure, heart rate, breathing rate, and blood oxygen level will be monitored often until the medicines you were given have worn off.   This information is not intended to replace advice given to you by your health care provider. Make sure you discuss any questions you have with your health care provider.   Document Released: 03/18/2005 Document Revised: 12/24/2014 Document Reviewed: 11/20/2013 Elsevier Interactive Patient Education [employer].

## 2015-06-22 ENCOUNTER — Encounter (HOSPITAL_COMMUNITY)
Admission: RE | Admit: 2015-06-22 | Discharge: 2015-06-22 | Disposition: A | Discharge status: Home / Self Care | Payer: BLUE CROSS/BLUE SHIELD | Source: Ambulatory Visit | Attending: Podiatry | Admitting: Podiatry

## 2015-06-22 ENCOUNTER — Encounter (HOSPITAL_COMMUNITY): Payer: Self-pay

## 2015-06-22 ENCOUNTER — Ambulatory Visit (HOSPITAL_COMMUNITY)
Admission: RE | Admit: 2015-06-22 | Discharge: 2015-06-22 | Disposition: A | Discharge status: Home / Self Care | Payer: BLUE CROSS/BLUE SHIELD | Source: Ambulatory Visit | Attending: Podiatry | Admitting: Podiatry

## 2015-06-22 DIAGNOSIS — Z9889 Other specified postprocedural states: Secondary | ICD-10-CM | POA: Insufficient documentation

## 2015-06-22 DIAGNOSIS — S93102A Unspecified subluxation of left toe(s), initial encounter: Secondary | ICD-10-CM | POA: Diagnosis not present

## 2015-06-22 DIAGNOSIS — M2042 Other hammer toe(s) (acquired), left foot: Secondary | ICD-10-CM

## 2015-06-22 LAB — BASIC METABOLIC PANEL
ANION GAP: 5 (ref 5–15)
BUN: 17 mg/dL (ref 6–20)
CALCIUM: 8.9 mg/dL (ref 8.9–10.3)
CO2: 29 mmol/L (ref 22–32)
CREATININE: 0.67 mg/dL (ref 0.44–1.00)
Chloride: 105 mmol/L (ref 101–111)
GLUCOSE: 107 mg/dL — AB (ref 65–99)
Potassium: 3.6 mmol/L (ref 3.5–5.1)
Sodium: 139 mmol/L (ref 135–145)

## 2015-06-22 LAB — CBC
HEMATOCRIT: 40.8 % (ref 36.0–46.0)
Hemoglobin: 12.9 g/dL (ref 12.0–15.0)
MCH: 27 pg (ref 26.0–34.0)
MCHC: 31.6 g/dL (ref 30.0–36.0)
MCV: 85.4 fL (ref 78.0–100.0)
PLATELETS: 190 10*3/uL (ref 150–400)
RBC: 4.78 MIL/uL (ref 3.87–5.11)
RDW: 13.4 % (ref 11.5–15.5)
WBC: 5.5 10*3/uL (ref 4.0–10.5)

## 2015-06-24 ENCOUNTER — Ambulatory Visit (HOSPITAL_COMMUNITY): Payer: BLUE CROSS/BLUE SHIELD

## 2015-06-24 ENCOUNTER — Ambulatory Visit (HOSPITAL_COMMUNITY): Payer: BLUE CROSS/BLUE SHIELD | Admitting: Anesthesiology

## 2015-06-24 ENCOUNTER — Ambulatory Visit (HOSPITAL_COMMUNITY)
Admission: RE | Admit: 2015-06-24 | Discharge: 2015-06-24 | Disposition: A | Discharge status: Home / Self Care | Payer: BLUE CROSS/BLUE SHIELD | Source: Ambulatory Visit | Attending: Podiatry | Admitting: Podiatry

## 2015-06-24 ENCOUNTER — Encounter (HOSPITAL_COMMUNITY)
Admission: RE | Disposition: A | Discharge status: Home / Self Care | Payer: Self-pay | Source: Ambulatory Visit | Attending: Podiatry

## 2015-06-24 DIAGNOSIS — M2012 Hallux valgus (acquired), left foot: Secondary | ICD-10-CM | POA: Insufficient documentation

## 2015-06-24 DIAGNOSIS — K219 Gastro-esophageal reflux disease without esophagitis: Secondary | ICD-10-CM | POA: Diagnosis not present

## 2015-06-24 DIAGNOSIS — Z87891 Personal history of nicotine dependence: Secondary | ICD-10-CM | POA: Insufficient documentation

## 2015-06-24 DIAGNOSIS — I10 Essential (primary) hypertension: Secondary | ICD-10-CM | POA: Diagnosis not present

## 2015-06-24 DIAGNOSIS — Z79899 Other long term (current) drug therapy: Secondary | ICD-10-CM | POA: Diagnosis not present

## 2015-06-24 DIAGNOSIS — F419 Anxiety disorder, unspecified: Secondary | ICD-10-CM | POA: Insufficient documentation

## 2015-06-24 DIAGNOSIS — M2042 Other hammer toe(s) (acquired), left foot: Secondary | ICD-10-CM | POA: Diagnosis present

## 2015-06-24 DIAGNOSIS — M1991 Primary osteoarthritis, unspecified site: Secondary | ICD-10-CM | POA: Diagnosis not present

## 2015-06-24 DIAGNOSIS — Z9889 Other specified postprocedural states: Secondary | ICD-10-CM

## 2015-06-24 HISTORY — PX: WEIL OSTEOTOMY: SHX5044

## 2015-06-24 HISTORY — PX: AIKEN OSTEOTOMY: SHX6331

## 2015-06-24 HISTORY — PX: HAMMER TOE SURGERY: SHX385

## 2015-06-24 HISTORY — PX: BUNIONECTOMY: SHX129

## 2015-06-24 HISTORY — PX: FLEXOR TENOTOMY: SHX6342

## 2015-06-24 SURGERY — BUNIONECTOMY
Anesthesia: Monitor Anesthesia Care | Laterality: Left

## 2015-06-24 MED ORDER — LIDOCAINE HCL (PF) 1 % IJ SOLN
INTRAMUSCULAR | Status: AC
Start: 2015-06-24 — End: 2015-06-24
  Filled 2015-06-24: qty 30

## 2015-06-24 MED ORDER — MIDAZOLAM HCL 2 MG/2ML IJ SOLN
INTRAMUSCULAR | Status: AC
  Filled 2015-06-24: qty 2

## 2015-06-24 MED ORDER — FENTANYL CITRATE (PF) 100 MCG/2ML IJ SOLN
INTRAMUSCULAR | Status: DC | PRN
  Administered 2015-06-24 (×4): 25 ug via INTRAVENOUS

## 2015-06-24 MED ORDER — FENTANYL CITRATE (PF) 100 MCG/2ML IJ SOLN
25.0000 ug | INTRAMUSCULAR | Status: DC | PRN
  Administered 2015-06-24 (×4): 50 ug via INTRAVENOUS
  Filled 2015-06-24 (×2): qty 2

## 2015-06-24 MED ORDER — BUPIVACAINE HCL (PF) 0.5 % IJ SOLN
INTRAMUSCULAR | Status: DC | PRN
  Administered 2015-06-24: 20 mL

## 2015-06-24 MED ORDER — PROPOFOL 10 MG/ML IV BOLUS
INTRAVENOUS | Status: AC
  Filled 2015-06-24: qty 20

## 2015-06-24 MED ORDER — LACTATED RINGERS IV SOLN
INTRAVENOUS | Status: DC
  Administered 2015-06-24 (×2): via INTRAVENOUS

## 2015-06-24 MED ORDER — BUPIVACAINE HCL (PF) 0.5 % IJ SOLN
INTRAMUSCULAR | Status: AC
  Filled 2015-06-24: qty 30

## 2015-06-24 MED ORDER — MIDAZOLAM HCL 2 MG/2ML IJ SOLN
1.0000 mg | INTRAMUSCULAR | Status: DC | PRN
  Administered 2015-06-24: 2 mg via INTRAVENOUS

## 2015-06-24 MED ORDER — PROPOFOL 500 MG/50ML IV EMUL
INTRAVENOUS | Status: DC | PRN
  Administered 2015-06-24: 100 ug/kg/min via INTRAVENOUS
  Administered 2015-06-24 (×2): via INTRAVENOUS
  Administered 2015-06-24: 50 ug/kg/min via INTRAVENOUS

## 2015-06-24 MED ORDER — SODIUM CHLORIDE 0.9 % IJ SOLN
INTRAMUSCULAR | Status: AC
  Filled 2015-06-24: qty 10

## 2015-06-24 MED ORDER — MIDAZOLAM HCL 5 MG/5ML IJ SOLN
INTRAMUSCULAR | Status: DC | PRN
  Administered 2015-06-24 (×2): 1 mg via INTRAVENOUS

## 2015-06-24 MED ORDER — EPHEDRINE SULFATE 50 MG/ML IJ SOLN
INTRAMUSCULAR | Status: AC
  Filled 2015-06-24: qty 1

## 2015-06-24 MED ORDER — ONDANSETRON HCL 4 MG/2ML IJ SOLN
4.0000 mg | Freq: Once | INTRAMUSCULAR | Status: AC | PRN
  Administered 2015-06-24: 4 mg via INTRAVENOUS
  Filled 2015-06-24: qty 2

## 2015-06-24 MED ORDER — SODIUM CHLORIDE 0.9 % IR SOLN
Status: DC | PRN
  Administered 2015-06-24: 1000 mL

## 2015-06-24 MED ORDER — CEFAZOLIN SODIUM-DEXTROSE 2-3 GM-% IV SOLR
2.0000 g | Freq: Once | INTRAVENOUS | Status: AC
  Administered 2015-06-24: 2 g via INTRAVENOUS

## 2015-06-24 MED ORDER — CEFAZOLIN SODIUM-DEXTROSE 2-3 GM-% IV SOLR
INTRAVENOUS | Status: AC
  Filled 2015-06-24: qty 50

## 2015-06-24 MED ORDER — FENTANYL CITRATE (PF) 100 MCG/2ML IJ SOLN
INTRAMUSCULAR | Status: AC
  Filled 2015-06-24: qty 2

## 2015-06-24 SURGICAL SUPPLY — 70 items
0.062 Kwire Double Ended ×3 IMPLANT
BAG HAMPER (MISCELLANEOUS) ×3 IMPLANT
BANDAGE ELASTIC 3 VELCRO NS (GAUZE/BANDAGES/DRESSINGS) ×3 IMPLANT
BANDAGE ELASTIC 4 VELCRO NS (GAUZE/BANDAGES/DRESSINGS) ×3 IMPLANT
BANDAGE ESMARK 4X12 BL STRL LF (DISPOSABLE) ×1 IMPLANT
BENZOIN TINCTURE PRP APPL 2/3 (GAUZE/BANDAGES/DRESSINGS) ×3 IMPLANT
BIT DRILL 2.0 HCS 150 (BIT) IMPLANT
BIT DRILL MEMOFIX 1.7 (BIT) ×3 IMPLANT
BIT DRILL MICR ACTRK 2 LNG PRF (BIT) ×1 IMPLANT
BLADE 15 SAFETY STRL DISP (BLADE) IMPLANT
BLADE AVERAGE 25MMX9MM (BLADE) ×1
BLADE AVERAGE 25X9 (BLADE) ×2 IMPLANT
BLADE OSC/SAG 11.5X5.5X.38 (BLADE) ×6 IMPLANT
BLADE OSC/SAG 18.5X9 THN (BLADE) IMPLANT
BLADE OSC/SAGITTAL MD 5.5X18 (BLADE) IMPLANT
BNDG CONFORM 2 STRL LF (GAUZE/BANDAGES/DRESSINGS) ×3 IMPLANT
BNDG ESMARK 4X12 BLUE STRL LF (DISPOSABLE) ×3
BNDG GAUZE ELAST 4 BULKY (GAUZE/BANDAGES/DRESSINGS) ×3 IMPLANT
BOOT STEPPER DURA LG (SOFTGOODS) IMPLANT
BOOT STEPPER DURA MED (SOFTGOODS) ×3 IMPLANT
BOOT STEPPER DURA SM (SOFTGOODS) IMPLANT
BOOT STEPPER DURA XLG (SOFTGOODS) IMPLANT
CHLORAPREP W/TINT 26ML (MISCELLANEOUS) ×3 IMPLANT
CLOSURE WOUND 1/2 X4 (GAUZE/BANDAGES/DRESSINGS) ×1
CLOTH BEACON ORANGE TIMEOUT ST (SAFETY) ×3 IMPLANT
COVER LIGHT HANDLE STERIS (MISCELLANEOUS) ×6 IMPLANT
CUFF TOURNIQUET SINGLE 18IN (TOURNIQUET CUFF) ×3 IMPLANT
DECANTER SPIKE VIAL GLASS SM (MISCELLANEOUS) ×3 IMPLANT
DRAPE OEC MINIVIEW 54X84 (DRAPES) ×3 IMPLANT
DRILL MICRO ACUTRAK 2 LNG PROF (BIT) ×3
DRSG ADAPTIC 3X8 NADH LF (GAUZE/BANDAGES/DRESSINGS) ×3 IMPLANT
ELECT REM PT RETURN 9FT ADLT (ELECTROSURGICAL) ×3
ELECTRODE REM PT RTRN 9FT ADLT (ELECTROSURGICAL) ×1 IMPLANT
GAUZE KERLIX 2  STERILE LF (GAUZE/BANDAGES/DRESSINGS) ×3 IMPLANT
GAUZE SPONGE 4X4 12PLY STRL (GAUZE/BANDAGES/DRESSINGS) ×3 IMPLANT
GLOVE BIO SURGEON STRL SZ7 (GLOVE) ×6 IMPLANT
GLOVE BIO SURGEON STRL SZ7.5 (GLOVE) ×3 IMPLANT
GLOVE BIOGEL PI IND STRL 7.0 (GLOVE) ×3 IMPLANT
GLOVE BIOGEL PI IND STRL 7.5 (GLOVE) ×1 IMPLANT
GLOVE BIOGEL PI INDICATOR 7.0 (GLOVE) ×6
GLOVE BIOGEL PI INDICATOR 7.5 (GLOVE) ×2
GOWN STRL REUS W/TWL LRG LVL3 (GOWN DISPOSABLE) ×9 IMPLANT
GUIDEWIRE ORTHO 062 (WIRE) ×3 IMPLANT
GUIDEWIRE ORTHO MICROSHT  ACUT (WIRE) ×2
GUIDEWIRE ORTHO MICROSHT .035 (WIRE) ×1 IMPLANT
K-WIRE 6 (WIRE)
KIT ROOM TURNOVER AP CYSTO (KITS) ×3 IMPLANT
KIT ROOM TURNOVER APOR (KITS) ×3 IMPLANT
KWIRE 6 (WIRE) IMPLANT
MANIFOLD NEPTUNE II (INSTRUMENTS) ×3 IMPLANT
NEEDLE HYPO 27GX1-1/4 (NEEDLE) ×9 IMPLANT
NS IRRIG 1000ML POUR BTL (IV SOLUTION) ×3 IMPLANT
PACK BASIC LIMB (CUSTOM PROCEDURE TRAY) ×3 IMPLANT
PAD ARMBOARD 7.5X6 YLW CONV (MISCELLANEOUS) ×3 IMPLANT
PIN CAPS ORTHO GREEN .062 (PIN) ×3 IMPLANT
RASP SM TEAR CROSS CUT (RASP) IMPLANT
SCREW ACUTRAK 2 MICRO 20MM (Screw) ×3 IMPLANT
SCREW QUICK SNAP 2.0X14MM (Screw) ×3 IMPLANT
SET BASIN LINEN APH (SET/KITS/TRAYS/PACK) ×3 IMPLANT
SPONGE GAUZE 4X4 12PLY (GAUZE/BANDAGES/DRESSINGS) ×2 IMPLANT
SPONGE LAP 18X18 X RAY DECT (DISPOSABLE) ×3 IMPLANT
SPONGE LAP 4X18 X RAY DECT (DISPOSABLE) IMPLANT
STAPLE MEMOFIX NITINOL 8X8X8 (Staple) ×3 IMPLANT
STRIP CLOSURE SKIN 1/2X4 (GAUZE/BANDAGES/DRESSINGS) ×2 IMPLANT
SUT PROLENE 4 0 PS 2 18 (SUTURE) ×6 IMPLANT
SUT VIC AB 2-0 CT2 27 (SUTURE) ×3 IMPLANT
SUT VIC AB 4-0 PS2 27 (SUTURE) ×6 IMPLANT
SUT VICRYL AB 3-0 FS1 BRD 27IN (SUTURE) ×3 IMPLANT
SYR CONTROL 10ML LL (SYRINGE) ×9 IMPLANT
TOWEL OR 17X26 4PK STRL BLUE (TOWEL DISPOSABLE) IMPLANT

## 2015-06-24 NOTE — Op Note (Signed)
OPERATIVE NOTE  DATE OF PROCEDURE 06/24/2015   SURGEON Marcheta Grammes, [place]  OR STAFF Circulator: Sue Lush, RN Scrub Person: Karin Lieu, CST RN First Assistant: Glory Rosebush, RN   PREOPERATIVE DIAGNOSIS 1.  Hallux valgus, left foot 2.  Hammertoe deformity of the 2nd toe, left foot 3.  Hammertoe deformity of the 3rd toe, left foot  POSTOPERATIVE DIAGNOSIS Same  PROCEDURE 1.  Austin bunionectomy, left foot 2.  Akin osteotomy, left foot 3.  Z-lengthening of the extensor hallucis longus tendon, left foot   4.  Athrodesis of the PIPJ with Z-lengthening of the extensor digitorum longus tendon of the 2nd toe, left foot 5.  Weil osteotomy of the 2nd metatarsal, left foot 6.  Percutaneous flexor tenotomy of the 3rd toe, left foot  ANESTHESIA Monitor Anesthesia Care   HEMOSTASIS Pneumatic ankle tourniquet set at 250 mmHg  ESTIMATED BLOOD LOSS Minimal (<5 cc)  MATERIALS USED 1.  Acumed Acutrak 2 Micro 20 mm 2.  Integra Memofix Staple 8 x 8 x 8 mm 3.  Integra Qucksnap Screw 2.0 mm x 14 mm 4.  0.062" k-wire  INJECTABLES 0.5% Marcaine plain  PATHOLOGY None  COMPLICATIONS None  INDICATIONS:   Status post hammertoe repair of the left second toe with persistent dorsal contracture and medially deviation of the left second toe.  Lateral deviation of the left hallux.  Flexor contracture of the left third toe.  DESCRIPTION OF THE PROCEDURE:  The patient was brought to the operating room and placed on the operative table in the supine position.  A pneumatic ankle tourniquet was applied to the operative extremity.  Following sedation, the surgical site was anesthetized with 0.5% Marcaine plain.  The foot was then prepped, scrubbed, and draped in the usual sterile technique.  The foot was elevated, exsanguinated and the pneumatic ankle tourniquet inflated to 250 mmHg.    Attention was then directed to the dorsomedial aspect of the first metatarsophalangeal joint where a  linear incision was made medial and parallel to the course of the extensor hallucis longus tendon.  The incision was deepened through subcutaneous tissues.  Vital neurovascular structures were identified and retracted.  All bleeders were identified and cauterized.  The incision was deepened to the level of the first metatarsophalangeal joint capsule.  An inverted L-type capsulotomy was performed over the dorsal aspect of the first metatarsophalangeal joint.  The capsular and periosteal structures were dissected free of their osseous attachments and reflected medially and laterally thus exposing the head of the first metatarsal at the operative site.  Utilizing a sagittal saw, the medial prominence was resected and passed from the operative field.  All rough edges were smoothed.    Attention was directed to the first interspace via the original skin incision.  Using sharp and blunt dissection, the fibular sesamoid was freed of its soft tissue attachments proximally, laterally and distally.  The conjoined tendon of the adductor hallucis muscle was identified and transected at its attachment to the base of the proximal phalanx of the hallux.  The lateral contracture present on the hallux was noted to be reduced and the sesamoid apparatus was noted to float into a more corrected medial position.    Attention was redirected to the medial aspect of the first metatarsal head where a through and through chevron osteotomy was created in the metaphyseal region of the first metatarsal bone using a sagittal bone saw.  The apex of the osteotomy pointed distally.  Upon completion of  the osteotomy the capital fragment was distracted and shifted laterally into a more corrected position.  A k-wire from the Acumed cannulated screw set was inserted across the osteotomy site from a dorsal proximal to plantar distal direction.  An Acumed cannulated screw was inserted across the k-wire.  Adequate compression was noted.  The  position of the screw was confirmed with fluoroscopy and noted to be in acceptable alignment.  The remaining medial bone shelf was resected utilizing a sagittal bone saw and passed from the operative field.    The periosteal incision was extended distally over the proximal phalanx.  The periosteum was reflected medially and laterally to expose the proximal phalanx.  A permanent saw was used to resect a wedge of bone with the apex oriented laterally and the base medially.  The lateral hinge was feathered to allow for adequate closure of the osteotomy.  The lateral hinge fractured.  The osteotomy was fixated with an Integra staple.  The osteotomy was evaluated and found to be stable with appropriate transverse plane alignment of the hallux.  However, there was dorsal contracture of the hallux at the MPJ.  A Z-lengthening of the extensor hallucis longus tendon was performed with reduction of the sagittal plane position of the hallux  The area was copiously irrigated. The periosteal and capsular tissues were approximated with 2-0 Vicryl suture.  4-0 Vicryl was used to approximate the subcutaneous tissues. 4-0 Vicryl was used to approximate the skin in a subcuticular manner.  Incision closure was reinforced with 4-0 Prolene.  Attention was directed to the dorsal aspect of the left second toe.  2 converging semi-elliptical incisions were made encompassing a scar from her previous surgery.  A wedge of skin was removed and passed from the operative field.  Dissection was continued deep down to the level of the extensor digitorum longus tendon.  The extensor tendon was taut with contracture of the second MPJ.  The extensor hood was released.  The dorsal contracture of the second MPJ.  A Z-lengthening of the extensor digitorum longus tendon was performed.  The tendon was reflected proximally and distally to allow for adequate exposure of the proximal interphalangeal joint and metatarsophalangeal joint capsule.  A linear  capsular incision was performed.  The capsular structures were reflected medially and laterally exposing the second metatarsal head.  A Weil osteotomy of the second metatarsal was performed.  The capital fragment was translated proximally and fixated with an Integra Quicksnap screw.  The position of the screw was confirmed with fluoroscopy and noted to be in acceptable alignment.  The remaining dorsal bone shelf was resected using a bone rongeur.  Attention was directed to the proximal interphalangeal joint.  The joint did not appear fused.  Using a power bone saw the previous arthrodesis site was resected.  A 0.062 inch Kirschner wire was placed through the middle phalanx exiting the distal aspect of the second toe and retrograded across the proximal interphalangeal joint.  The second toe was positioned and the K wire was across the MPJ.  The K wire was bent, cut and capped.  The surgical wound was irrigated with copious amounts of sterile irrigant.  The second MPJ capsule was reapproximated using 4-0 Vicryl.  The extensor tendon was reapproximated using 4-0 Vicryl in a lengthened position.  The subcutaneous structures reapproximated using 4-0 Vicryl.  The skin was reapproximated using 4-0 Prolene.  Attention was directed to the plantar aspect of the left third toe.  A pertains flexor tenotomy was  performed.  The distal contracture of the left third toe was found to be reduced.  The skin was reapproximated using 4-0 Prolene in a simple suture technique.  A sterile compressive dressing was applied to the left foot.  The pneumatic ankle tourniquet was deflated and a prompt hyperemic response was noted to all digits of the operative foot.  The patient tolerated the procedure well.  The patient was then transferred to PACU with vital signs stable and vascular status intact to all toes of the operative foot.

## 2015-06-24 NOTE — Transfer of Care (Signed)
Immediate Anesthesia Transfer of Care Note  Patient: Sarah Bullock  Procedure(s) Performed: Procedure(s): AUSTIN BUNIONECTOMY LEFT FOOT (Left) AIKEN OSTEOTOMY LEFT FOOT (Left) WEIL OSTEOTOMY 2ND METATARSAL LEFT FOOT (Left) HAMMER TOE CORRECTION 2ND TOE LEFT FOOT WITH TENDON LENGTHENING (Left) PERCUTANEOUS FLEXOR TENOTOMY OF THE THIRD TOE LEFT FOOT (Left)  Patient Location: PACU  Anesthesia Type:MAC  Level of Consciousness: awake and patient cooperative  Airway & Oxygen Therapy: Patient Spontanous Breathing and Patient connected to face mask oxygen  Post-op Assessment: Report given to RN, Post -op Vital signs reviewed and stable and Patient moving all extremities  Post vital signs: Reviewed and stable  Last Vitals:  Filed Vitals:   06/24/15 0733  BP: 124/74  Pulse: 68  Temp: 36.6 C  Resp: 16    Complications: No apparent anesthesia complications

## 2015-06-24 NOTE — Brief Op Note (Signed)
BRIEF OPERATIVE NOTE  DATE OF PROCEDURE 06/24/2015  SURGEON Marcheta Grammes, [place]  OR STAFF Circulator: Sue Lush, RN Scrub Person: Karin Lieu, CST RN First Assistant: Glory Rosebush, RN   PREOPERATIVE DIAGNOSIS 1.  Hallux valgus, left foot 2.  Hammertoe deformity of the 2nd toe, left foot 3.  Hammertoe deformity of the 3rd toe, left foot  POSTOPERATIVE DIAGNOSIS Same  PROCEDURE 1.  Austin bunionectomy, left foot 2.  Akin osteotomy, left foot 3.  Z-lengthening of the extensor hallucis longus tendon, left foot   4.  Athrodesis of the PIPJ with Z-lengthening of the extensor digitorum longus tendon of the 2nd toe, left foot 5.  Weil osteotomy of the 2nd metatarsal, left foot 6.  Percutaneous flexor tenotomy of the 3rd toe, left foot  ANESTHESIA Monitor Anesthesia Care   HEMOSTASIS Pneumatic ankle tourniquet set at 250 mmHg  ESTIMATED BLOOD LOSS Minimal (<5 cc)  MATERIALS USED 1.  Acumed Acutrak 2 Micro 20 mm 2.  Integra Memofix Staple 8 x 8 x 8 mm 3.  Integra Qucksnap Screw 2.0 mm x 14 mm 4.  0.062" k-wire  INJECTABLES 0.5% Marcaine plain  PATHOLOGY None  COMPLICATIONS None

## 2015-06-24 NOTE — H&P (Signed)
HISTORY AND PHYSICAL INTERVAL NOTE:  06/24/2015  8:13 AM  Sarah Bullock  has presented today for surgery, with the diagnosis of hammer toe deformity 2nd toe left foot, hallux valgus, pain.  The various methods of treatment have been discussed with the patient.  No guarantees were given.  After consideration of risks, benefits and other options for treatment, the patient has consented to surgery.  I have reviewed the patients' chart and labs.    Patient Vitals for the past 24 hrs:  BP Temp Temp src Pulse Resp SpO2  06/24/15 0733 124/74 mmHg 97.9 F (36.6 C) Oral 68 16 95 %    A history and physical examination was performed in my office.  The patient was reexamined.  There have been no changes to this history and physical examination.  Marcheta Grammes, DPM

## 2015-06-24 NOTE — Anesthesia Preprocedure Evaluation (Signed)
Anesthesia Evaluation  Patient identified by MRN, date of birth, ID band Patient awake    Reviewed: Allergy & Precautions, NPO status , Patient's Chart, lab work & pertinent test results  Airway Mallampati: II  TM Distance: >3 FB Neck ROM: Full    Dental  (+) Teeth Intact, Dental Advisory Given   Pulmonary shortness of breath and with exertion, pneumonia, resolved, former smoker,    Pulmonary exam normal        Cardiovascular hypertension, Pt. on medications + angina with exertion Normal cardiovascular exam     Neuro/Psych  Headaches, Anxiety    GI/Hepatic GERD  Medicated and Controlled,  Endo/Other    Renal/GU      Musculoskeletal  (+) Arthritis , Osteoarthritis,    Abdominal Normal abdominal exam  (+)   Peds  Hematology   Anesthesia Other Findings   Reproductive/Obstetrics                             Anesthesia Physical Anesthesia Plan  ASA: III  Anesthesia Plan: MAC   Post-op Pain Management:    Induction: Intravenous  Airway Management Planned: Nasal Cannula  Additional Equipment:   Intra-op Plan:   Post-operative Plan:   Informed Consent: I have reviewed the patients History and Physical, chart, labs and discussed the procedure including the risks, benefits and alternatives for the proposed anesthesia with the patient or authorized representative who has indicated his/her understanding and acceptance.   Dental advisory given  Plan Discussed with: CRNA  Anesthesia Plan Comments:         Anesthesia Quick Evaluation

## 2015-06-24 NOTE — Anesthesia Postprocedure Evaluation (Signed)
Anesthesia Post Note  Patient: Sarah Bullock  Procedure(s) Performed: Procedure(s) (LRB): AUSTIN BUNIONECTOMY LEFT FOOT (Left) AIKEN OSTEOTOMY LEFT FOOT (Left) WEIL OSTEOTOMY 2ND METATARSAL LEFT FOOT (Left) HAMMER TOE CORRECTION 2ND TOE LEFT FOOT WITH TENDON LENGTHENING (Left) PERCUTANEOUS FLEXOR TENOTOMY OF THE THIRD TOE LEFT FOOT (Left)  Patient location during evaluation: PACU Anesthesia Type: MAC Level of consciousness: awake and alert and patient cooperative Pain management: pain level controlled Vital Signs Assessment: post-procedure vital signs reviewed and stable Respiratory status: spontaneous breathing and nonlabored ventilation Cardiovascular status: blood pressure returned to baseline Postop Assessment: no signs of nausea or vomiting Anesthetic complications: no    Last Vitals:  Filed Vitals:   06/24/15 1115 06/24/15 1118  BP: 102/46 102/46  Pulse:  61  Temp:  36.5 C  Resp:  11    Last Pain: There were no vitals filed for this visit.               Marcela Alatorre J

## 2015-06-24 NOTE — Discharge Instructions (Signed)
These instructions will give you an idea of what to expect after surgery and how to manage issues that may arise before your first post op office visit.  Pain Management Pain is best managed by staying ahead of it. If pain gets out of control, it is difficult to get it back under control. Local anesthesia that lasts 6-8 hours is used to numb the foot and decrease pain.  For the best pain control, take the pain medication every 4 hours for the first 2 days post op. On the third day pain medication can be taken as needed.   Post Op Nausea Nausea is common after surgery, so it is managed proactively.  If prescribed, use the prescribed nausea medication regularly for the first 2 days post op.  Bandages Do not worry if there is blood on the bandage. What looks like a lot of blood on the bandage is actually a small amount. Blood on the dressing spreads out as it is absorbed by the gauze, the same way a drop of water spreads out on a paper towel.  If the bandages feel wet or dry, stiff and uncomfortable, call the office during office hours and we will schedule a time for you to have the bandage changed.  Unless you are specifically told otherwise, we will do the first bandage change in the office.  Keep your bandage dry. If the bandage becomes wet or soiled, notify the office and we will schedule a time to change the bandage.  Activity It is best to spend most of the first 2 days after surgery lying down with the foot elevated above the level of your heart. You may put weight on your heel while wearing the surgical boot.   You may only get up to go to the restroom.  Driving Do not drive until you are able to respond in an emergency (i.e. slam on the brakes). This usually occurs after the bone has healed - 6 to 8 weeks.  Call the Office If you have a fever over 101F.  If you have increasing pain after the initial post op pain has settled down.  If you have increasing redness, swelling, or  drainage.  If you have any questions or concerns.

## 2015-06-25 ENCOUNTER — Encounter (HOSPITAL_COMMUNITY): Payer: Self-pay | Admitting: Podiatry

## 2017-04-05 ENCOUNTER — Other Ambulatory Visit: Payer: Self-pay | Admitting: Podiatry

## 2017-04-14 ENCOUNTER — Other Ambulatory Visit (HOSPITAL_COMMUNITY): Payer: BLUE CROSS/BLUE SHIELD

## 2017-04-19 NOTE — Patient Instructions (Signed)
Sarah Bullock  04/19/2017     @[email or web address] @   Your procedure is scheduled on  05/03/2017   Report to [facility] at  67   A.M.  Call this number if you have problems the morning of surgery:  [phone]   Remember:  Do not eat food or drink liquids after midnight.  Take these medicines the morning of surgery with A SIP OF WATER  Lisinopril, mobic.   Do not wear jewelry, make-up or nail polish.  Do not wear lotions, powders, or perfumes, or deodorant.  Do not shave 48 hours prior to surgery.  Men may shave face and neck.  Do not bring valuables to the hospital.  [facility] is not responsible for any belongings or valuables.  Contacts, dentures or bridgework may not be worn into surgery.  Leave your suitcase in the car.  After surgery it may be brought to your room.  For patients admitted to the hospital, discharge time will be determined by your treatment team.  Patients discharged the day of surgery will not be allowed to drive home.   Name and phone number of your driver:   Family Special instructions:  None  Please read over the following fact sheets that you were given. Anesthesia Post-op Instructions and Care and Recovery After Surgery       Metatarsal Osteotomy Metatarsal osteotomy is a surgical procedure to correct a toe dislocation or deformity. The surgery may also help to relieve foot pain. Your metatarsals are the five long bones that connect your toes to the rest of your foot. Osteotomy is a surgical cut into a bone to reshape and reposition the bone or joint. Tell a health care provider about:  Any allergies you have.  All medicines you are taking, including vitamins, herbs, eye drops, creams, and over-the-counter medicines.  Any problems you or family members have had with anesthetic medicines.  Any blood disorders you have.  Any surgeries you have had.  Any medical conditions you have. What are the  risks? Generally, this is a safe procedure. However, problems may occur, including:  Stiffness.  Pain.  Infection.  Bleeding.  Allergic reactions to medicines.  Nerve damage that causes numbness.  Failure of the osteotomy to heal.  A blood clot that forms in your leg and travels to your lungs (pulmonary embolism).  What happens before the procedure?  Your health care provider may order imaging tests of your foot.  Follow instructions from your health care provider about eating or drinking restrictions.  Ask your health care provider about: ? Changing or stopping your regular medicines. This is especially important if you are taking diabetes medicines or blood thinners. ? Taking medicines such as aspirin and ibuprofen. These medicines can thin your blood. Do not take these medicines before your procedure if your health care provider instructs you not to.  Plan to have someone take you home after the procedure.  Ask your health care provider how your surgical site will be marked or identified.  You may be given antibiotic medicine to help prevent infection. What happens during the procedure?  To reduce your risk of infection: ? Your health care team will wash or sanitize their hands. ? Your skin will be washed with soap. ? Hair may be removed from the surgical area.  An IV tube will be started in one of your veins.  You will be given  one or more of the following: ? A medicine to help you relax (sedative). ? A medicine to numb the area (local anesthetic). ? A medicine to make you fall asleep (general anesthetic). ? A medicine that is injected into your spine to numb the area below and slightly above the injection site (spinal anesthetic). ? A medicine that is injected into an area of your body to numb everything below the injection site (regional anesthetic).  A surgical cut (incision) will be made on your foot over the metatarsal bone that will be treated.  A cut  will be made in the bone to shorten or straighten the bone.  Metal pins, plates, or screws may be used to hold the bone in the right position.  The incision will be closed with stitches (sutures) or staples.  A bandage (dressing) will be placed around the front and bottom of your foot. The procedure may vary among health care providers and hospitals. What happens after the procedure?  Your blood pressure, heart rate, breathing rate, and blood oxygen level will be monitored often until the medicines you were given have worn off.  You may be given walking aids, such as: ? A custom-fitted hard-soled shoe that keeps weight on your heel. ? A walking boot. ? A splint. ? Crutches or a walker to help you walk without putting weight on your foot.  Do not drive for 24 hours if you received a sedative. This information is not intended to replace advice given to you by your health care provider. Make sure you discuss any questions you have with your health care provider. Document Released: 03/16/2015 Document Revised: 09/10/2015 Document Reviewed: 11/27/2014 Elsevier Interactive Patient Education  2018 [place].  Metatarsal Osteotomy, Care After Refer to this sheet in the next few weeks. These instructions provide you with information about caring for yourself after your procedure. Your health care provider may also give you more specific instructions. Your treatment has been planned according to current medical practices, but problems sometimes occur. Call your health care provider if you have any problems or questions after your procedure. What can I expect after the procedure? After the procedure, it is common to have:  Soreness.  Pain.  Stiffness.  Swelling.  Follow these instructions at home: If you have a splint:  Wear the splint as told by your health care provider. Remove it only as told by your health care provider.  Loosen the splint if your toes tingle, become numb, or  turn cold and blue.  Do not let your splint get wet if it is not waterproof.  Keep the splint clean. Bathing  Do not take baths, swim, or use a hot tub until your health care provider approves. Ask your health care provider if you can take showers. You may only be allowed to take sponge baths for bathing.  If your splint is not waterproof, cover it with a watertight plastic bag when you take a bath or a shower.  Keep the bandage (dressing) dry until your health care provider says it can be removed. Incision care  Follow instructions from your health care provider about how to take care of your cut from surgery (incision). Make sure you: ? Wash your hands with soap and water before you change your bandage (dressing). If soap and water are not available, use hand sanitizer. ? Change your dressing as told by your health care provider. ? Leave stitches (sutures), skin glue, or adhesive strips in place. These skin closures  may need to stay in place for 2 weeks or longer. If adhesive strip edges start to loosen and curl up, you may trim the loose edges. Do not remove adhesive strips completely unless your health care provider tells you to do that.  Check your incision area every day for signs of infection. Check for: ? More redness, swelling, or pain. ? More fluid or blood. ? Warmth. ? Pus or a bad smell. Managing pain, stiffness, and swelling   If directed, apply ice to the injured area. ? Put ice in a plastic bag. ? Place a towel between your skin and the bag. ? Leave the ice on for 20 minutes, 2-3 times a day.  Move your toes often to avoid stiffness and to lessen swelling.  Raise (elevate) the injured area above the level of your heart while you are sitting or lying down. Driving  Do not drive or operate heavy machinery while taking prescription pain medicine.  Do not drive for 24 hours if you received a sedative.  Ask your health care provider when it is safe to drive if you  have a dressing, splint, special shoe, or walking boot on your foot. General instructions  If you were given a splint, special shoe, or walking boot, wear it as told by your health care provider.  Return to your normal activities as told by your health care provider. Ask your health care provider what activities are safe for you.  Do not use the injured limb to support your body weight until your health care provider says that you can. Use crutches or a walker as told by your health care provider.  Do not use any tobacco products, such as cigarettes, chewing tobacco, and e-cigarettes. Tobacco can delay bone healing. If you need help quitting, ask your health care provider.  Take over-the-counter and prescription medicines only as told by your health care provider.  Keep all follow-up visits as told by your health care provider. This is important. Contact a health care provider if:  You have a fever.  Your dressing becomes wet, loose, or stained with blood or discharge.  You have pus or a bad smell coming from your incision or bandage.  Your foot becomes red, swollen, or tender.  You have pain or stiffness that does not get better or gets worse.  You have tingling or numbness in your foot that does not get better or gets worse. Get help right away if:  You develop a warm and tender swelling in your leg.  You have chest pain.  You have trouble breathing. This information is not intended to replace advice given to you by your health care provider. Make sure you discuss any questions you have with your health care provider. Document Released: 03/16/2015 Document Revised: 09/10/2015 Document Reviewed: 11/27/2014 Elsevier Interactive Patient Education  2018 [place] A bunionectomy is a surgical procedure to remove a bunion. A bunion is a visible bump of bone on the inside of your foot where your big toe meets the rest of your foot. A bunion can develop when  pressure turns this bone (first metatarsal) toward the other toes. Shoes that are too tight are the most common cause of bunions. Bunions can also be caused by diseases, such as arthritis and polio. You may need a bunionectomy if your bunion is very large and painful or it affects your ability to walk. Tell a health care provider about:  Any allergies you have.  All medicines you  are taking, including vitamins, herbs, eye drops, creams, and over-the-counter medicines.  Any problems you or family members have had with anesthetic medicines.  Any blood disorders you have.  Any surgeries you have had.  Any medical conditions you have. What are the risks? Generally, this is a safe procedure. However, problems may occur, including:  Infection.  Pain.  Nerve damage.  Bleeding or blood clots.  Reactions to medicines.  Numbness, stiffness, or arthritis in your toe.  Foot problems that continue even after the procedure.  What happens before the procedure?  Ask your health care provider about: ? Changing or stopping your regular medicines. This is especially important if you are taking diabetes medicines or blood thinners. ? Taking medicines such as aspirin and ibuprofen. These medicines can thin your blood. Do not take these medicines before your procedure if your health care provider instructs you not to.  Do not drink alcohol before the procedure as directed by your health care provider.  Do not use tobacco products, including cigarettes, chewing tobacco, or electronic cigarettes, before the procedure as directed by your health care provider. If you need help quitting, ask your health care provider.  Ask your health care provider what kind of medicine you will be given during your procedure. A bunionectomy may be done using one of these: ? A medicine that numbs the area (local anesthetic). ? A medicine that makes you go to sleep (general anesthetic). If you will be given general  anesthetic, do not eat or drink anything after midnight on the night before the procedure or as directed by your health care provider. What happens during the procedure?  An IV tube may be inserted into a vein.  You will be given local anesthetic or general anesthetic.  The surgeon will make a cut (incision) over the enlarged area at the first joint of the big toe. The surgeon will remove the bunion.  You may have more than one incision if any of the bones in your big toe need to be moved. A bone itself may need to be cut.  Sometimes the tissues around the big toe may also need to be cut then tightened or loosened to reposition the toe.  Screws or other hardware may be used to keep your foot in thecorrect position.  The incision will be closed with stitches (sutures) and covered with adhesive strips or another type of bandage (dressing). What happens after the procedure?  You may spend some time in a recovery area.  Your blood pressure, heart rate, breathing rate, and blood oxygen level will be monitored often until the medicines you were given have worn off. This information is not intended to replace advice given to you by your health care provider. Make sure you discuss any questions you have with your health care provider. Document Released: 03/18/2005 Document Revised: 09/10/2015 Document Reviewed: 11/20/2013 Elsevier Interactive Patient Education  2018 [place], Care After Refer to this sheet in the next few weeks. These instructions provide you with information about caring for yourself after your procedure. Your health care provider may also give you more specific instructions. Your treatment has been planned according to current medical practices, but problems sometimes occur. Call your health care provider if you have any problems or questions after your procedure. What can I expect after the procedure? After your procedure, it is typical to have the  following:  Pain.  Swelling.  Follow these instructions at home:  Take medicines only as directed by  your health care provider.  Apply ice to the affected area: ? Put ice in a plastic bag. ? Place a towel between your skin and the bag. ? Leave the ice on for 20 minutes, 2-3 times a day.  There are many different ways to close and cover an incision, including stitches (sutures), skin glue, and adhesive strips. Follow your health care provider's instructions about: ? Incision care. ? Bandage (dressing) changes and removal. ? Incision closure removal.  Keep your foot raised (elevated) while you are resting.  You may need to wear a brace or a boot that keeps your foot in the proper position. Wear the boot or brace as directed by your health care provider.  Use crutches, canes, or walkers as directed by your health care provider.  Do not put weight on your foot until your health care provider approves.  Rest as needed. Follow your health care provider's instructions on when you can return to your usual activities, like walking and driving.  Do not wear high heels or tight-fitting shoes.  Do physical therapy exercises to strengthen your foot as directed by your health care provider. Contact a health care provider if:  You have increased bleeding from the incision.  You have drainage, redness, swelling, or pain at your incision site.  You have a fever or chills.  You notice a bad smell coming from the incision or the dressing.  Your dressing gets wet or falls off.  Your calf begins to swell.  Your toes feel numb or stiff. Get help right away if:  You have a rash.  You have difficulty breathing. This information is not intended to replace advice given to you by your health care provider. Make sure you discuss any questions you have with your health care provider. Document Released: 10/22/2004 Document Revised: 09/10/2015 Document Reviewed: 11/20/2013 Elsevier  Interactive Patient Education  2018 [place] Toe Hammer toe is a change in the shape (a deformity) of your second, third, or fourth toe. The deformity causes the middle joint of your toe to stay bent. This causes pain, especially when you are wearing shoes. Hammer toe starts gradually. At first, the toe can be straightened. Gradually over time, the deformity becomes stiff and permanent. Early treatments to keep the toe straight may relieve pain. As the deformity becomes stiff and permanent, surgery may be needed to straighten the toe. What are the causes? Hammer toe is caused by abnormal bending of the toe joint that is closest to your foot. It happens gradually over time. This pulls on the muscles and connections (tendons) of the toe joint, making them weak and stiff. It is often related to wearing shoes that are too short or narrow and do not let your toes straighten. What increases the risk? You may be at greater risk for hammer toe if you:  Are female.  Are older.  Wear shoes that are too small.  Wear high-heeled shoes that pinch your toes.  Are a Engineer, mining.  Have a second toe that is longer than your big toe (first toe).  Injure your foot or toe.  Have arthritis.  Have a family history of hammer toe.  Have a nerve or muscle disorder.  What are the signs or symptoms? The main symptoms of this condition are pain and deformity of the toe. The pain is worse when wearing shoes, walking, or running. Other symptoms may include:  Corns or calluses over the bent part of the toe or between  the toes.  Redness and a burning feeling on the toe.  An open sore that forms on the top of the toe.  Not being able to straighten the toe.  How is this diagnosed? This condition is diagnosed based on your symptoms and a physical exam. During the exam, your health care provider will try to straighten your toe to see how stiff the deformity is. You may also have tests, such  as:  A blood test to check for rheumatoid arthritis.  An X-ray to show how severe the deformity is.  How is this treated? Treatment for this condition will depend on how stiff the deformity is. Surgery is often needed. However, sometimes a hammer toe can be straightened without surgery. Treatments that do not involve surgery include:  Taping the toe into a straightened position.  Using pads and cushions to protect the toe (orthotics).  Wearing shoes that provide enough room for the toes.  Doing toe-stretching exercises at home.  Taking an NSAID to reduce pain and swelling.  If these treatments do not help or the toe cannot be straightened, surgery is the next option. The most common surgeries used to straighten a hammer toe include:  Arthroplasty. In this procedure, part of the joint is removed, and that allows the toe to straighten.  Fusion. In this procedure, cartilage between the two bones of the joint is taken out and the bones are fused together into one longer bone.  Implantation. In this procedure, part of the bone is removed and replaced with an implant to let the toe move again.  Flexor tendon transfer. In this procedure, the tendons that curl the toes down (flexor tendons) are repositioned.  Follow these instructions at home:  Take over-the-counter and prescription medicines only as told by your health care provider.  Do toe straightening and stretching exercises as told by your health care provider.  Keep all follow-up visits as told by your health care provider. This is important. How is this prevented?  Wear shoes that give your toes enough room and do not cause pain.  Do not wear high-heeled shoes. Contact a health care provider if:  Your pain gets worse.  Your toe becomes red or swollen.  You develop an open sore on your toe. This information is not intended to replace advice given to you by your health care provider. Make sure you discuss any questions  you have with your health care provider. Document Released: 04/01/2000 Document Revised: 10/23/2015 Document Reviewed: 07/29/2015 Elsevier Interactive Patient Education  2018 [place] Anesthesia is a term that refers to techniques, procedures, and medicines that help a person stay safe and comfortable during a medical procedure. Monitored anesthesia care, or sedation, is one type of anesthesia. Your anesthesia specialist may recommend sedation if you will be having a procedure that does not require you to be unconscious, such as:  Cataract surgery.  A dental procedure.  A biopsy.  A colonoscopy.  During the procedure, you may receive a medicine to help you relax (sedative). There are three levels of sedation:  Mild sedation. At this level, you may feel awake and relaxed. You will be able to follow directions.  Moderate sedation. At this level, you will be sleepy. You may not remember the procedure.  Deep sedation. At this level, you will be asleep. You will not remember the procedure.  The more medicine you are given, the deeper your level of sedation will be. Depending on how you respond  to the procedure, the anesthesia specialist may change your level of sedation or the type of anesthesia to fit your needs. An anesthesia specialist will monitor you closely during the procedure. Let your health care provider know about:  Any allergies you have.  All medicines you are taking, including vitamins, herbs, eye drops, creams, and over-the-counter medicines.  Any use of steroids (by mouth or as a cream).  Any problems you or family members have had with sedatives and anesthetic medicines.  Any blood disorders you have.  Any surgeries you have had.  Any medical conditions you have, such as sleep apnea.  Whether you are pregnant or may be pregnant.  Any use of cigarettes, alcohol, or street drugs. What are the risks? Generally, this is a safe  procedure. However, problems may occur, including:  Getting too much medicine (oversedation).  Nausea.  Allergic reaction to medicines.  Trouble breathing. If this happens, a breathing tube may be used to help with breathing. It will be removed when you are awake and breathing on your own.  Heart trouble.  Lung trouble.  Before the procedure Staying hydrated Follow instructions from your health care provider about hydration, which may include:  Up to 2 hours before the procedure - you may continue to drink clear liquids, such as water, clear fruit juice, black coffee, and plain tea.  Eating and drinking restrictions Follow instructions from your health care provider about eating and drinking, which may include:  8 hours before the procedure - stop eating heavy meals or foods such as meat, fried foods, or fatty foods.  6 hours before the procedure - stop eating light meals or foods, such as toast or cereal.  6 hours before the procedure - stop drinking milk or drinks that contain milk.  2 hours before the procedure - stop drinking clear liquids.  Medicines Ask your health care provider about:  Changing or stopping your regular medicines. This is especially important if you are taking diabetes medicines or blood thinners.  Taking medicines such as aspirin and ibuprofen. These medicines can thin your blood. Do not take these medicines before your procedure if your health care provider instructs you not to.  Tests and exams  You will have a physical exam.  You may have blood tests done to show: ? How well your kidneys and liver are working. ? How well your blood can clot.  General instructions  Plan to have someone take you home from the hospital or clinic.  If you will be going home right after the procedure, plan to have someone with you for 24 hours.  What happens during the procedure?  Your blood pressure, heart rate, breathing, level of pain and overall  condition will be monitored.  An IV tube will be inserted into one of your veins.  Your anesthesia specialist will give you medicines as needed to keep you comfortable during the procedure. This may mean changing the level of sedation.  The procedure will be performed. After the procedure  Your blood pressure, heart rate, breathing rate, and blood oxygen level will be monitored until the medicines you were given have worn off.  Do not drive for 24 hours if you received a sedative.  You may: ? Feel sleepy, clumsy, or nauseous. ? Feel forgetful about what happened after the procedure. ? Have a sore throat if you had a breathing tube during the procedure. ? Vomit. This information is not intended to replace advice given to you by your health  care provider. Make sure you discuss any questions you have with your health care provider. Document Released: 12/29/2004 Document Revised: 09/11/2015 Document Reviewed: 07/26/2015 Elsevier Interactive Patient Education  2018 [place], Care After These instructions provide you with information about caring for yourself after your procedure. Your health care provider may also give you more specific instructions. Your treatment has been planned according to current medical practices, but problems sometimes occur. Call your health care provider if you have any problems or questions after your procedure. What can I expect after the procedure? After your procedure, it is common to:  Feel sleepy for several hours.  Feel clumsy and have poor balance for several hours.  Feel forgetful about what happened after the procedure.  Have poor judgment for several hours.  Feel nauseous or vomit.  Have a sore throat if you had a breathing tube during the procedure.  Follow these instructions at home: For at least 24 hours after the procedure:   Do not: ? Participate in activities in which you could fall or become  injured. ? Drive. ? Use heavy machinery. ? Drink alcohol. ? Take sleeping pills or medicines that cause drowsiness. ? Make important decisions or sign legal documents. ? Take care of children on your own.  Rest. Eating and drinking  Follow the diet that is recommended by your health care provider.  If you vomit, drink water, juice, or soup when you can drink without vomiting.  Make sure you have little or no nausea before eating solid foods. General instructions  Have a responsible adult stay with you until you are awake and alert.  Take over-the-counter and prescription medicines only as told by your health care provider.  If you smoke, do not smoke without supervision.  Keep all follow-up visits as told by your health care provider. This is important. Contact a health care provider if:  You keep feeling nauseous or you keep vomiting.  You feel light-headed.  You develop a rash.  You have a fever. Get help right away if:  You have trouble breathing. This information is not intended to replace advice given to you by your health care provider. Make sure you discuss any questions you have with your health care provider. Document Released: 07/26/2015 Document Revised: 11/25/2015 Document Reviewed: 07/26/2015 Elsevier Interactive Patient Education  [employer].

## 2017-04-25 ENCOUNTER — Encounter (HOSPITAL_COMMUNITY)
Admission: RE | Admit: 2017-04-25 | Discharge: 2017-04-25 | Disposition: A | Discharge status: Home / Self Care | Payer: Commercial Managed Care - PPO | Source: Ambulatory Visit | Attending: Podiatry | Admitting: Podiatry

## 2017-04-25 ENCOUNTER — Ambulatory Visit (HOSPITAL_COMMUNITY)
Admission: RE | Admit: 2017-04-25 | Discharge: 2017-04-25 | Disposition: A | Discharge status: Home / Self Care | Payer: Commercial Managed Care - PPO | Source: Ambulatory Visit | Attending: Podiatry | Admitting: Podiatry

## 2017-04-25 ENCOUNTER — Other Ambulatory Visit (HOSPITAL_COMMUNITY): Payer: Self-pay | Admitting: Podiatry

## 2017-04-25 ENCOUNTER — Other Ambulatory Visit: Payer: Self-pay

## 2017-04-25 ENCOUNTER — Encounter (HOSPITAL_COMMUNITY): Payer: Self-pay

## 2017-04-25 DIAGNOSIS — I4581 Long QT syndrome: Secondary | ICD-10-CM | POA: Insufficient documentation

## 2017-04-25 DIAGNOSIS — Z01810 Encounter for preprocedural cardiovascular examination: Secondary | ICD-10-CM | POA: Diagnosis not present

## 2017-04-25 DIAGNOSIS — M2011 Hallux valgus (acquired), right foot: Secondary | ICD-10-CM | POA: Diagnosis not present

## 2017-04-25 DIAGNOSIS — M79672 Pain in left foot: Secondary | ICD-10-CM | POA: Insufficient documentation

## 2017-04-25 DIAGNOSIS — M7731 Calcaneal spur, right foot: Secondary | ICD-10-CM | POA: Insufficient documentation

## 2017-04-25 DIAGNOSIS — M79671 Pain in right foot: Secondary | ICD-10-CM

## 2017-04-25 DIAGNOSIS — M7732 Calcaneal spur, left foot: Secondary | ICD-10-CM | POA: Insufficient documentation

## 2017-04-25 LAB — BASIC METABOLIC PANEL
Anion gap: 13 (ref 5–15)
BUN: 17 mg/dL (ref 6–20)
CALCIUM: 9.3 mg/dL (ref 8.9–10.3)
CO2: 25 mmol/L (ref 22–32)
CREATININE: 0.8 mg/dL (ref 0.44–1.00)
Chloride: 100 mmol/L — ABNORMAL LOW (ref 101–111)
GFR calc non Af Amer: >60 mL/min (ref >60)
GLUCOSE: 113 mg/dL — AB (ref 65–99)
Potassium: 3 mmol/L — ABNORMAL LOW (ref 3.5–5.1)
Sodium: 138 mmol/L (ref 135–145)

## 2017-04-25 LAB — CBC WITH DIFFERENTIAL/PLATELET
BASOS PCT: 1 %
Basophils Absolute: 0 10*3/uL (ref 0.0–0.1)
Eosinophils Absolute: 0.2 10*3/uL (ref 0.0–0.7)
Eosinophils Relative: 2 %
HEMATOCRIT: 40 % (ref 36.0–46.0)
Hemoglobin: 12.6 g/dL (ref 12.0–15.0)
Lymphocytes Relative: 40 %
Lymphs Abs: 2.9 10*3/uL (ref 0.7–4.0)
MCH: 26.6 pg (ref 26.0–34.0)
MCHC: 31.5 g/dL (ref 30.0–36.0)
MCV: 84.6 fL (ref 78.0–100.0)
MONO ABS: 0.4 10*3/uL (ref 0.1–1.0)
MONOS PCT: 5 %
NEUTROS ABS: 3.8 10*3/uL (ref 1.7–7.7)
Neutrophils Relative %: 52 %
Platelets: 205 10*3/uL (ref 150–400)
RBC: 4.73 MIL/uL (ref 3.87–5.11)
RDW: 13.1 % (ref 11.5–15.5)
WBC: 7.2 10*3/uL (ref 4.0–10.5)

## 2017-04-25 NOTE — Progress Notes (Signed)
   04/25/17 1443  OBSTRUCTIVE SLEEP APNEA  Have you ever been diagnosed with sleep apnea through a sleep study? No  Do you snore loudly (loud enough to be heard through closed doors)?  1  Do you often feel tired, fatigued, or sleepy during the daytime (such as falling asleep during driving or talking to someone)? 0  Has anyone observed you stop breathing during your sleep? 0  Do you have, or are you being treated for high blood pressure? 1  BMI more than 35 kg/m2? 1  Age > 50 (1-yes) 1  Neck circumference greater than:Female 16 inches or larger, Female 17inches or larger? 0  Female Gender (Yes=1) 0  Obstructive Sleep Apnea Score 4

## 2017-04-26 NOTE — Pre-Procedure Instructions (Signed)
Potassium of 3.0 shown to Dr Caprice Beaver. He will have patient get RX for potassium and wants Istat on arrival  Morning of surgery.

## 2017-05-03 ENCOUNTER — Encounter (HOSPITAL_COMMUNITY)
Admission: RE | Disposition: A | Discharge status: Home / Self Care | Payer: Self-pay | Source: Ambulatory Visit | Attending: Podiatry

## 2017-05-03 ENCOUNTER — Ambulatory Visit (HOSPITAL_COMMUNITY): Payer: Commercial Managed Care - PPO | Admitting: Anesthesiology

## 2017-05-03 ENCOUNTER — Other Ambulatory Visit: Payer: Self-pay

## 2017-05-03 ENCOUNTER — Ambulatory Visit (HOSPITAL_COMMUNITY)
Admission: RE | Admit: 2017-05-03 | Discharge: 2017-05-03 | Disposition: A | Discharge status: Home / Self Care | Payer: Commercial Managed Care - PPO | Source: Ambulatory Visit | Attending: Podiatry | Admitting: Podiatry

## 2017-05-03 ENCOUNTER — Ambulatory Visit (HOSPITAL_COMMUNITY): Payer: Commercial Managed Care - PPO

## 2017-05-03 DIAGNOSIS — Z79899 Other long term (current) drug therapy: Secondary | ICD-10-CM | POA: Insufficient documentation

## 2017-05-03 DIAGNOSIS — R51 Headache: Secondary | ICD-10-CM | POA: Diagnosis not present

## 2017-05-03 DIAGNOSIS — M2011 Hallux valgus (acquired), right foot: Secondary | ICD-10-CM | POA: Diagnosis present

## 2017-05-03 DIAGNOSIS — R0602 Shortness of breath: Secondary | ICD-10-CM | POA: Diagnosis not present

## 2017-05-03 DIAGNOSIS — Z9889 Other specified postprocedural states: Secondary | ICD-10-CM

## 2017-05-03 DIAGNOSIS — I10 Essential (primary) hypertension: Secondary | ICD-10-CM | POA: Diagnosis not present

## 2017-05-03 DIAGNOSIS — F419 Anxiety disorder, unspecified: Secondary | ICD-10-CM | POA: Diagnosis not present

## 2017-05-03 DIAGNOSIS — Z87891 Personal history of nicotine dependence: Secondary | ICD-10-CM | POA: Diagnosis not present

## 2017-05-03 DIAGNOSIS — M19039 Primary osteoarthritis, unspecified wrist: Secondary | ICD-10-CM | POA: Diagnosis not present

## 2017-05-03 DIAGNOSIS — M25571 Pain in right ankle and joints of right foot: Secondary | ICD-10-CM | POA: Diagnosis not present

## 2017-05-03 DIAGNOSIS — M79675 Pain in left toe(s): Secondary | ICD-10-CM

## 2017-05-03 DIAGNOSIS — M25572 Pain in left ankle and joints of left foot: Secondary | ICD-10-CM | POA: Diagnosis not present

## 2017-05-03 DIAGNOSIS — Z8249 Family history of ischemic heart disease and other diseases of the circulatory system: Secondary | ICD-10-CM | POA: Insufficient documentation

## 2017-05-03 DIAGNOSIS — Z809 Family history of malignant neoplasm, unspecified: Secondary | ICD-10-CM | POA: Diagnosis not present

## 2017-05-03 DIAGNOSIS — K219 Gastro-esophageal reflux disease without esophagitis: Secondary | ICD-10-CM | POA: Insufficient documentation

## 2017-05-03 DIAGNOSIS — M2041 Other hammer toe(s) (acquired), right foot: Secondary | ICD-10-CM | POA: Diagnosis not present

## 2017-05-03 HISTORY — PX: BUNIONECTOMY: SHX129

## 2017-05-03 HISTORY — PX: STERIOD INJECTION: SHX5046

## 2017-05-03 HISTORY — PX: HAMMER TOE SURGERY: SHX385

## 2017-05-03 HISTORY — PX: WEIL OSTEOTOMY: SHX5044

## 2017-05-03 LAB — POCT I-STAT, CHEM 8
BUN: 18 mg/dL (ref 6–20)
CALCIUM ION: 1.13 mmol/L — AB (ref 1.15–1.40)
Chloride: 104 mmol/L (ref 101–111)
Creatinine, Ser: 0.7 mg/dL (ref 0.44–1.00)
GLUCOSE: 113 mg/dL — AB (ref 65–99)
HCT: 41 % (ref 36.0–46.0)
HEMOGLOBIN: 13.9 g/dL (ref 12.0–15.0)
Potassium: 3.9 mmol/L (ref 3.5–5.1)
Sodium: 141 mmol/L (ref 135–145)
TCO2: 27 mmol/L (ref 22–32)

## 2017-05-03 SURGERY — CORRECTION, HAMMER TOE
Anesthesia: Monitor Anesthesia Care | Site: Second Toe | Laterality: Right

## 2017-05-03 MED ORDER — PROPOFOL 500 MG/50ML IV EMUL
INTRAVENOUS | Status: DC | PRN
  Administered 2017-05-03: 10:00:00 via INTRAVENOUS
  Administered 2017-05-03: 50 ug/kg/min via INTRAVENOUS
  Administered 2017-05-03: 11:00:00 via INTRAVENOUS

## 2017-05-03 MED ORDER — ONDANSETRON HCL 4 MG/2ML IJ SOLN
4.0000 mg | Freq: Once | INTRAMUSCULAR | Status: AC
  Administered 2017-05-03: 4 mg via INTRAVENOUS

## 2017-05-03 MED ORDER — BUPIVACAINE HCL (PF) 0.5 % IJ SOLN
INTRAMUSCULAR | Status: DC | PRN
  Administered 2017-05-03: 18 mL

## 2017-05-03 MED ORDER — FENTANYL CITRATE (PF) 100 MCG/2ML IJ SOLN
INTRAMUSCULAR | Status: DC | PRN
  Administered 2017-05-03 (×2): 25 ug via INTRAVENOUS

## 2017-05-03 MED ORDER — FENTANYL CITRATE (PF) 100 MCG/2ML IJ SOLN
INTRAMUSCULAR | Status: AC
  Filled 2017-05-03: qty 2

## 2017-05-03 MED ORDER — CHLORHEXIDINE GLUCONATE CLOTH 2 % EX PADS: 6.0000 | MEDICATED_PAD | Freq: Once | CUTANEOUS | Status: DC

## 2017-05-03 MED ORDER — CEFAZOLIN SODIUM-DEXTROSE 2-4 GM/100ML-% IV SOLN
INTRAVENOUS | Status: AC
  Filled 2017-05-03: qty 100

## 2017-05-03 MED ORDER — 0.9 % SODIUM CHLORIDE (POUR BTL) OPTIME
TOPICAL | Status: DC | PRN
  Administered 2017-05-03: 1000 mL

## 2017-05-03 MED ORDER — MIDAZOLAM HCL 5 MG/5ML IJ SOLN
INTRAMUSCULAR | Status: DC | PRN
  Administered 2017-05-03 (×2): 1 mg via INTRAVENOUS

## 2017-05-03 MED ORDER — PROPOFOL 10 MG/ML IV BOLUS
INTRAVENOUS | Status: AC
  Filled 2017-05-03: qty 20

## 2017-05-03 MED ORDER — ONDANSETRON HCL 4 MG/2ML IJ SOLN
INTRAMUSCULAR | Status: AC
  Filled 2017-05-03: qty 2

## 2017-05-03 MED ORDER — CEFAZOLIN SODIUM-DEXTROSE 2-4 GM/100ML-% IV SOLN
2.0000 g | INTRAVENOUS | Status: AC
  Administered 2017-05-03: 2 g via INTRAVENOUS

## 2017-05-03 MED ORDER — CEFAZOLIN SODIUM-DEXTROSE 2-4 GM/100ML-% IV SOLN: 2.0000 g | Freq: Once | INTRAVENOUS | Status: DC

## 2017-05-03 MED ORDER — DEXAMETHASONE SODIUM PHOSPHATE 4 MG/ML IJ SOLN
4.0000 mg | INTRAMUSCULAR | Status: AC
  Administered 2017-05-03: 4 mg via INTRAVENOUS

## 2017-05-03 MED ORDER — MIDAZOLAM HCL 2 MG/2ML IJ SOLN
1.0000 mg | INTRAMUSCULAR | Status: AC
  Administered 2017-05-03: 2 mg via INTRAVENOUS

## 2017-05-03 MED ORDER — DEXAMETHASONE SODIUM PHOSPHATE 4 MG/ML IJ SOLN
INTRAMUSCULAR | Status: AC
  Filled 2017-05-03: qty 1

## 2017-05-03 MED ORDER — MIDAZOLAM HCL 2 MG/2ML IJ SOLN
INTRAMUSCULAR | Status: AC
  Filled 2017-05-03: qty 2

## 2017-05-03 MED ORDER — LACTATED RINGERS IV SOLN
INTRAVENOUS | Status: DC
  Administered 2017-05-03: 08:00:00 via INTRAVENOUS

## 2017-05-03 MED ORDER — FENTANYL CITRATE (PF) 100 MCG/2ML IJ SOLN: 25.0000 ug | INTRAMUSCULAR | Status: DC | PRN

## 2017-05-03 MED ORDER — DEXAMETHASONE SODIUM PHOSPHATE 4 MG/ML IJ SOLN
INTRAMUSCULAR | Status: DC | PRN
  Administered 2017-05-03: 4 mg

## 2017-05-03 MED ORDER — BUPIVACAINE HCL (PF) 0.5 % IJ SOLN
INTRAMUSCULAR | Status: AC
  Filled 2017-05-03: qty 30

## 2017-05-03 MED ORDER — FENTANYL CITRATE (PF) 100 MCG/2ML IJ SOLN
25.0000 ug | Freq: Once | INTRAMUSCULAR | Status: AC
  Administered 2017-05-03: 25 ug via INTRAVENOUS

## 2017-05-03 SURGICAL SUPPLY — 58 items
BAG HAMPER (MISCELLANEOUS) ×5 IMPLANT
BANDAGE ELASTIC 4 LF NS (GAUZE/BANDAGES/DRESSINGS) ×5 IMPLANT
BANDAGE ESMARK 4X12 BL STRL LF (DISPOSABLE) ×3 IMPLANT
BENZOIN TINCTURE PRP APPL 2/3 (GAUZE/BANDAGES/DRESSINGS) ×5 IMPLANT
BIT DRILL 1.5 (BIT) ×3
BIT DRILL 1.5MM (BIT) ×3 IMPLANT
BIT DRILL 2.0 HCS 150 (BIT) ×5 IMPLANT
BIT DRILL MINI QC 2.0X65 (BIT) ×5 IMPLANT
BLADE AVERAGE 25MMX9MM (BLADE) ×1
BLADE AVERAGE 25X9 (BLADE) ×4 IMPLANT
BLADE OSC/SAG 11.5X5.5X.38 (BLADE) ×5 IMPLANT
BLADE OSC/SAGITTAL MD 5.5X18 (BLADE) ×5 IMPLANT
BLADE SURG 15 STRL LF DISP TIS (BLADE) ×9 IMPLANT
BLADE SURG 15 STRL SS (BLADE) ×6
BNDG CONFORM 2 STRL LF (GAUZE/BANDAGES/DRESSINGS) ×5 IMPLANT
BNDG ESMARK 4X12 BLUE STRL LF (DISPOSABLE) ×5
BNDG GAUZE ELAST 4 BULKY (GAUZE/BANDAGES/DRESSINGS) ×5 IMPLANT
CHLORAPREP W/TINT 26ML (MISCELLANEOUS) ×5 IMPLANT
CLOSURE WOUND 1/2 X4 (GAUZE/BANDAGES/DRESSINGS) ×1
CLOTH BEACON ORANGE TIMEOUT ST (SAFETY) ×5 IMPLANT
COVER LIGHT HANDLE STERIS (MISCELLANEOUS) ×10 IMPLANT
CUFF TOURNIQUET SINGLE 18IN (TOURNIQUET CUFF) ×5 IMPLANT
DECANTER SPIKE VIAL GLASS SM (MISCELLANEOUS) ×5 IMPLANT
DRAPE OEC MINIVIEW 54X84 (DRAPES) ×5 IMPLANT
DRILL BIT 1.5MM (BIT) ×2
DRSG ADAPTIC 3X8 NADH LF (GAUZE/BANDAGES/DRESSINGS) ×5 IMPLANT
ELECT REM PT RETURN 9FT ADLT (ELECTROSURGICAL) ×5
ELECTRODE REM PT RTRN 9FT ADLT (ELECTROSURGICAL) ×3 IMPLANT
GAUZE SPONGE 4X4 12PLY STRL (GAUZE/BANDAGES/DRESSINGS) ×5 IMPLANT
GLOVE BIO SURGEON STRL SZ7.5 (GLOVE) ×5 IMPLANT
GLOVE BIOGEL PI IND STRL 7.0 (GLOVE) ×6 IMPLANT
GLOVE BIOGEL PI INDICATOR 7.0 (GLOVE) ×4
GLOVE ECLIPSE 7.0 STRL STRAW (GLOVE) ×5 IMPLANT
GOWN STRL REUS W/ TWL XL LVL3 (GOWN DISPOSABLE) ×3 IMPLANT
GOWN STRL REUS W/TWL LRG LVL3 (GOWN DISPOSABLE) ×15 IMPLANT
GOWN STRL REUS W/TWL XL LVL3 (GOWN DISPOSABLE) ×2
K-WIRE    74408 ×5 IMPLANT
KIT ROOM TURNOVER AP CYSTO (KITS) ×5 IMPLANT
KIT ROOM TURNOVER APOR (KITS) ×5 IMPLANT
MANIFOLD NEPTUNE II (INSTRUMENTS) ×5 IMPLANT
NEEDLE HYPO 27GX1-1/4 (NEEDLE) ×15 IMPLANT
NS IRRIG 1000ML POUR BTL (IV SOLUTION) ×5 IMPLANT
PACK BASIC LIMB (CUSTOM PROCEDURE TRAY) ×5 IMPLANT
PAD ARMBOARD 7.5X6 YLW CONV (MISCELLANEOUS) ×5 IMPLANT
SCREW CANN COMP 3.0MMX18MM (Screw) ×5 IMPLANT
SCREW CORT TI ST 2.0X10 (Screw) ×5 IMPLANT
SET BASIN LINEN APH (SET/KITS/TRAYS/PACK) ×5 IMPLANT
SPONGE LAP 18X18 X RAY DECT (DISPOSABLE) ×5 IMPLANT
STAPLE FIXATION 2.0MM (Staple) ×5 IMPLANT
STAPLE FIXATION 8X8X8 (Staple) ×10 IMPLANT
STRIP CLOSURE SKIN 1/2X4 (GAUZE/BANDAGES/DRESSINGS) ×4 IMPLANT
SUT PROLENE 4 0 PS 2 18 (SUTURE) ×10 IMPLANT
SUT VIC AB 2-0 CT2 27 (SUTURE) ×5 IMPLANT
SUT VIC AB 4-0 PS2 27 (SUTURE) ×10 IMPLANT
SUT VICRYL AB 3-0 FS1 BRD 27IN (SUTURE) ×5 IMPLANT
SYR CONTROL 10ML LL (SYRINGE) ×15 IMPLANT
TOWEL OR 17X26 4PK STRL BLUE (TOWEL DISPOSABLE) ×5 IMPLANT
k wire ×5 IMPLANT

## 2017-05-03 NOTE — H&P (Signed)
HISTORY AND PHYSICAL INTERVAL NOTE:  05/03/2017  8:40 AM  Sarah Bullock  has presented today for surgery, with the diagnosis of hammer toe 2nd digit right foot, hallux valgus right foot, pain of 3rd toe left foot, pain right foot.  The various methods of treatment have been discussed with the patient.  No guarantees were given.  After consideration of risks, benefits and other options for treatment, the patient has consented to surgery.  I have reviewed the patients' chart and labs.    Patient Vitals for the past 24 hrs:  BP Temp Temp src Pulse Resp SpO2  05/03/17 0835 - - - - 18 95 %  05/03/17 0830 115/67 - - - 14 92 %  05/03/17 0825 129/75 - - - 15 94 %  05/03/17 0820 (!) 141/78 - - - (!) 29 97 %  05/03/17 0815 131/76 - - - 13 97 %  05/03/17 0810 139/73 - - - 14 96 %  05/03/17 0805 138/67 - - - 14 97 %  05/03/17 0800 (!) 141/76 - - - 16 97 %  05/03/17 0755 139/77 - - - (!) 31 97 %  05/03/17 0750 132/83 - - - 11 96 %  05/03/17 0748 132/83 98.7 F (37.1 C) Oral 67 - 97 %    A history and physical examination was performed in my office.  The patient was reexamined.  There have been no changes to this history and physical examination.  Marcheta Grammes, DPM

## 2017-05-03 NOTE — Discharge Instructions (Signed)
These instructions will give you an idea of what to expect after surgery and how to manage issues that may arise before your first post op office visit. ° °Pain Management °Pain is best managed by “staying ahead” of it. If pain gets out of control, it is difficult to get it back under control. Local anesthesia that lasts 6-8 hours is used to numb the foot and decrease pain.  For the best pain control, take the pain medication every 4 hours for the first 2 days post op. On the third day pain medication can be taken as needed.  ° °Post Op Nausea °Nausea is common after surgery, so it is managed proactively.  °If prescribed, use the prescribed nausea medication regularly for the first 2 days post op. ° °Bandages °Do not worry if there is blood on the bandage. What looks like a lot of blood on the bandage is actually a small amount. Blood on the dressing spreads out as it is absorbed by the gauze, the same way a drop of water spreads out on a paper towel.  °If the bandages feel wet or dry, stiff and uncomfortable, call the office during office hours and we will schedule a time for you to have the bandage changed.  °Unless you are specifically told otherwise, we will do the first bandage change in the office.  °Keep your bandage dry. If the bandage becomes wet or soiled, notify the office and we will schedule a time to change the bandage. ° °Activity °It is best to spend most of the first 2 days after surgery lying down with the foot elevated above the level of your heart. °You may put weight on your heel while wearing the CAM Walker (black boot).   °You may only get up to go to the restroom. ° °Driving °Do not drive until you are able to respond in an emergency (i.e. slam on the brakes). This usually occurs after the bone has healed - 6 to 8 weeks. ° °Call the Office °If you have a fever over 101°F.  °If you have increasing pain after the initial post op pain has settled down.  °If you have increasing redness, swelling,  or drainage.  °If you have any questions or concerns.  ° ° °PATIENT INSTRUCTIONS °POST-ANESTHESIA ° °IMMEDIATELY FOLLOWING SURGERY:  Do not drive or operate machinery for the first twenty four hours after surgery.  Do not make any important decisions for twenty four hours after surgery or while taking narcotic pain medications or sedatives.  If you develop intractable nausea and vomiting or a severe headache please notify your doctor immediately. ° °FOLLOW-UP:  Please make an appointment with your surgeon as instructed. You do not need to follow up with anesthesia unless specifically instructed to do so. ° °WOUND CARE INSTRUCTIONS (if applicable):  Keep a dry clean dressing on the anesthesia/puncture wound site if there is drainage.  Once the wound has quit draining you may leave it open to air.  Generally you should leave the bandage intact for twenty four hours unless there is drainage.  If the epidural site drains for more than 36-48 hours please call the anesthesia department. ° °QUESTIONS?:  Please feel free to call your physician or the hospital operator if you have any questions, and they will be happy to assist you.    ° ° ° °

## 2017-05-03 NOTE — Op Note (Signed)
OPERATIVE NOTE  DATE OF PROCEDURE 05/03/2017  SURGEON Marcheta Grammes, DPM  ASSISTANT SURGEON Jilda Panda, DPM  OR STAFF Circulator: Towanda Malkin, RN Relief Circulator: Laverta Baltimore, RN Scrub Person: Karin Lieu, CST   PREOPERATIVE DIAGNOSIS 1.  Hallux valgus, right foot 2.  Hammertoe deformity of the 2nd digit, right foot 3.  Pain, right foot 4.  Pain, left foot  POSTOPERATIVE DIAGNOSIS Same  PROCEDURE 1.  Austin bunionectomy, right foot 2.  Akin osteotomy, right foot 3.  Weil osteotomy of the 2nd metatarsal, right foot 4.  Hammertoe repair of the 2nd digit, right foot 5.  Corticosteroid injection, left foot  ANESTHESIA Monitor Anesthesia Care   HEMOSTASIS Pneumatic ankle tourniquet set at 250 mmHg  ESTIMATED BLOOD LOSS Minimal (<5 cc)  MATERIALS USED 1.  Synthes 3.0 mm screw 18 mm length x 1 2.  EasyClip Staples 8 mm x 2  3.  Synthes 2.0 mm screw 10 mm length x 1 4.  0.045" k-wire  INJECTABLES 0.5% Marcaine plain  PATHOLOGY None  COMPLICATIONS None  INDICATIONS:  Bunion deformity of right foot.  Worsening hammertoe deformity of the second digit of right foot.  Left foot pain.  DESCRIPTION OF THE PROCEDURE:  The patient was brought to the operating room and placed on the operative table in the supine position.  A pneumatic ankle tourniquet was applied to the operative extremity.  Following sedation, the surgical site was anesthetized with 0.5% Marcaine plain.  The foot was then prepped, scrubbed, and draped in the usual sterile technique.  The foot was elevated, exsanguinated and the pneumatic ankle tourniquet inflated to 250 mmHg.    Attention was directed to the dorsal medial aspect of the right foot.  A linear longitudinal incision was made medial and parallel to the extensor hallucis longus tendon.  Dissection was continued deep down to the level of the first metatarsal phalangeal joint capsule.  A T capsulotomy was performed.  The  periosteal and capsular structures were reflected to allow for adequate exposure of the distal first metatarsal and proximal phalanx.  The prominent medial eminence of the first metatarsal head was resected using a power bone saw.  A Chevron osteotomy was performed with the apex oriented distally.  The capital fragment was distracted and shifted into a more corrected lateral position.  The osteotomy was fixated with a Synthes 3.0 millimeter screw.  Adequate compression was noted.  Position of the screw was confirmed with fluoroscopy.  Attention was directed to the proximal phalanx where a wedge-shaped osteotomy was performed with the apex laterally and base medially.  The wedge of bone was removed and passed from the operative field.  The lateral hinge was feathered to allow for adequate closure of the osteotomy.  Fracture of the lateral hinge was noted.  The osteotomy was fixated with 2 EasyClip staples.  Position of the staples was confirmed with fluoroscopy.  The surgical wound was irrigated with copious amounts of sterile irrigant.  The periosteal and capsular structures were reapproximated using 2-0 and 3-0 Vicryl.  The subcutaneous structures were reapproximated using.  The skin was reapproximated using 4-0 Vicryl in a running subcuticular manner.  Attention was directed to the right second toe.  A linear longitudinal incision was made extending over the dorsal aspect of the second toe and second metatarsal phalangeal joint.  Dissection was continued deep down to the level of the extensor digitorum longus tendon.  The extensor hood was released.  A transverse tenotomy and  capsulotomy was performed at the proximal interphalangeal joint.  The extensor tendon was reflected proximally allowing exposure of the proximal interphalangeal joint and second metatarsal phalangeal joint.  The articular surface from the head of the proximal phalanx and base of the middle phalanx were resected using a power bone saw.  A  capsulotomy of the second metatarsophalangeal joint was performed.  A Weil osteotomy of the metatarsal was formed and fixated with a Synthes 2.0 millimeter screw.  Position of the screw was confirmed on fluoroscopy.  A 0.045 inch Kirschner wire was inserted through the middle phalanx exiting the distal aspect of the second toe.  The wire was retrograded into the proximal phalanx.  Position of the wire was confirmed with fluoroscopy.  The wire was bent and cut.  The extensor tendon was reapproximated using 4-0 Vicryl.  The surgical wound was irrigated with copious amounts of sterile irrigant.  Subcutaneous structures were reapproximated using 4-0 Vicryl.  The skin was reapproximated using Prolene.  A sterile compressive dressing was applied to the operative foot.  The pneumatic ankle tourniquet was deflated and a prompt hyperemic response was noted to all digits of the right foot.  Attention was directed to the dorsal aspect of the left foot.  The dorsal aspect of the foot was prepped with alcohol.  0.5 cc of dexamethasone was injected into the third intermetatarsal space.  The patient tolerated the procedure well.  The patient was then transferred to PACU with vital signs stable and vascular status intact to all toes of the operative foot.

## 2017-05-03 NOTE — Brief Op Note (Signed)
BRIEF OPERATIVE NOTE  DATE OF PROCEDURE 05/03/2017  SURGEON Marcheta Grammes, DPM  ASSISTANT SURGEON Jilda Panda, DPM  OR STAFF Circulator: Towanda Malkin, RN Relief Circulator: Laverta Baltimore, RN Scrub Person: Karin Lieu, CST   PREOPERATIVE DIAGNOSIS 1.  Hallux valgus, right foot 2.  Hammertoe deformity of the 2nd digit, right foot 3.  Pain, right foot 4.  Pain, left foot  POSTOPERATIVE DIAGNOSIS Same  PROCEDURE 1.  Austin bunionectomy, right foot 2.  Akin osteotomy, right foot 3.  Weil osteotomy of the 2nd metatarsal, right foot 4.  Hammertoe repair of the 2nd digit, right foot 5.  Corticosteroid injection, left foot  ANESTHESIA Monitor Anesthesia Care   HEMOSTASIS Pneumatic ankle tourniquet set at 250 mmHg  ESTIMATED BLOOD LOSS Minimal (<5 cc)  MATERIALS USED 1.  Synthes 3.0 mm screw 18 mm length x 1 2.  Synthes 2.0 mm screw 10 mm length x 1 3.  0.045" k-wire  INJECTABLES 0.5% Marcaine plain  PATHOLOGY None  COMPLICATIONS None

## 2017-05-03 NOTE — Anesthesia Postprocedure Evaluation (Signed)
Anesthesia Post Note  Patient: Sarah Bullock  Procedure(s) Performed: HAMMER TOE REPAIR 2ND DIGIT RIGHT FOOT (Right Second Toe) BUNIONECTOMY WITH AIKEN OSTEOTOMY RIGHT FOOT (Right Foot) CORTICOSTEROID INJECTION LEFT FOOT (Left Foot) WEIL OSTEOTOMY RIGHT FOOT (Right Foot)  Patient location during evaluation: PACU Anesthesia Type: MAC Level of consciousness: awake and alert Pain management: pain level controlled Vital Signs Assessment: post-procedure vital signs reviewed and stable Respiratory status: spontaneous breathing, nonlabored ventilation and respiratory function stable Cardiovascular status: blood pressure returned to baseline Postop Assessment: no apparent nausea or vomiting Anesthetic complications: no     Last Vitals:  Vitals:   05/03/17 0855 05/03/17 1145  BP:  113/70  Pulse:  85  Resp: (!) 21 (!) 26  Temp:  36.6 C  SpO2: 95% 100%    Last Pain:  Vitals:   05/03/17 0748  TempSrc: Oral  PainSc: 3                  Khamari Yousuf J

## 2017-05-03 NOTE — Anesthesia Preprocedure Evaluation (Signed)
Anesthesia Evaluation  Patient identified by MRN, date of birth, ID band Patient awake    Reviewed: Allergy & Precautions, NPO status , Patient's Chart, lab work & pertinent test results  Airway Mallampati: II  TM Distance: >3 FB Neck ROM: Full    Dental  (+) Teeth Intact, Dental Advisory Given   Pulmonary shortness of breath and with exertion, pneumonia, resolved, former smoker,    Pulmonary exam normal        Cardiovascular hypertension, Pt. on medications (-) anginaNormal cardiovascular exam+ dysrhythmias      Neuro/Psych  Headaches, Anxiety    GI/Hepatic GERD  Medicated and Controlled,  Endo/Other    Renal/GU      Musculoskeletal  (+) Arthritis , Osteoarthritis,    Abdominal   Peds  Hematology   Anesthesia Other Findings   Reproductive/Obstetrics                             Anesthesia Physical Anesthesia Plan  ASA: III  Anesthesia Plan: MAC   Post-op Pain Management:    Induction: Intravenous  PONV Risk Score and Plan:   Airway Management Planned: Simple Face Mask  Additional Equipment:   Intra-op Plan:   Post-operative Plan:   Informed Consent: I have reviewed the patients History and Physical, chart, labs and discussed the procedure including the risks, benefits and alternatives for the proposed anesthesia with the patient or authorized representative who has indicated his/her understanding and acceptance.     Plan Discussed with:   Anesthesia Plan Comments:         Anesthesia Quick Evaluation

## 2017-05-03 NOTE — Transfer of Care (Signed)
Immediate Anesthesia Transfer of Care Note  Patient: Sarah Bullock  Procedure(s) Performed: HAMMER TOE REPAIR 2ND DIGIT RIGHT FOOT (Right Second Toe) BUNIONECTOMY WITH AIKEN OSTEOTOMY RIGHT FOOT (Right Foot) CORTICOSTEROID INJECTION LEFT FOOT (Left Foot) WEIL OSTEOTOMY RIGHT FOOT (Right Foot)  Patient Location: PACU  Anesthesia Type:MAC  Level of Consciousness: awake, alert  and patient cooperative  Airway & Oxygen Therapy: Patient Spontanous Breathing and Patient connected to nasal cannula oxygen  Post-op Assessment: Report given to RN, Post -op Vital signs reviewed and stable and Patient moving all extremities  Post vital signs: Reviewed and stable  Last Vitals:  Vitals:   05/03/17 0850 05/03/17 0855  BP: 116/68   Pulse:    Resp: 12 (!) 21  Temp:    SpO2: 97% 95%    Last Pain:  Vitals:   05/03/17 0748  TempSrc: Oral  PainSc: 3       Patients Stated Pain Goal: 8 (53/29/92 4268)  Complications: No apparent anesthesia complications

## 2017-05-04 ENCOUNTER — Encounter (HOSPITAL_COMMUNITY): Payer: Self-pay | Admitting: Podiatry

## 2017-07-25 ENCOUNTER — Telehealth: Payer: Self-pay

## 2017-07-25 NOTE — Telephone Encounter (Signed)
Pt called- she stated she is aware that [facility] had wanted her to repeat her tcs in 10 years but she feels like she is high risk since her mother had colon cancer and she had polyps at her last tcs in 2014. She is requesting to repeat her tcs now instead of 10 years because she is worried about it.   Dr.Fields, is it ok to go ahead and triage her now instead of at 10 years?

## 2017-08-01 NOTE — Telephone Encounter (Signed)
PLEASE CALL PT. HER RISK FACTORS FOR DEVELOPING COLON CANCER INCLUDE: 1. One simple adenoma removed in 2014, 2. HER MOTHER HAD COLON CANCER AFTER THE AGE OF 60 AND 3. A BOY MASS INDEX OVER 30. IN SPITE OF THESE THREE THINGS SHE IS NOT CONSIDERED HIGH RISK FOR DEVELOPING COLON CANCER. HOWEVER SCREENING GUIDELINES CHANGED IN 2017 SO SHE CAN HAVE A COLONOSCOPY AS SOON AS APR 2019 AND AS LATE AS APR 2024. NEEDS PHENERGAN 12.5 MG IV IN PREOP.

## 2017-08-02 NOTE — Telephone Encounter (Signed)
Tried to call pt- NA-LMOM that we will need to schedule her a nurse visit. When I spoke with the pt before she wanted to go ahead and schedule now instead of waiting.   Please schedule nurse visit.

## 2017-08-02 NOTE — Telephone Encounter (Signed)
PATIENT ALREADY SCHEDULED FOR A NURSE VISIT

## 2017-08-15 ENCOUNTER — Ambulatory Visit: Payer: Commercial Managed Care - PPO

## 2017-08-28 ENCOUNTER — Encounter

## 2017-08-28 ENCOUNTER — Ambulatory Visit (INDEPENDENT_AMBULATORY_CARE_PROVIDER_SITE_OTHER): Payer: Self-pay

## 2017-08-28 DIAGNOSIS — Z86010 Personal history of colonic polyps: Secondary | ICD-10-CM

## 2017-08-28 MED ORDER — CLENPIQ 10-3.5-12 MG-GM -GM/160ML PO SOLN: 1.0000 | Freq: Once | ORAL | 0 refills | Status: DC

## 2017-08-28 NOTE — Patient Instructions (Signed)
[facility] INSTRUCTIONS   Patient Name:  Sarah Bullock Date of procedure: 09/01/17 Time to register at [facility] Stay:  8:30am Provider:  Dr. Oneida Alar   Please notify us immediately if you are diabetic, take iron supplements, or if you are on coumadin or any blood thinners.    Note: Do NOT refrigerate or freeze CLENPIQ. CLENPIQ is ready to drink. There is no need to add any other liquid or mix the medicine in the bottle before you start dosing.   08/31/17  1 Day prior to procedure:     CLEAR LIQUIDS ALL DAY--NO SOLID FOODS OR DAIRY PRODUCTS! See list of liquids that are allowed and items that are NOT allowed below.      You must drink plenty of CLEAR LIQUIDS starting before your bowel prep. It is important to stay adequately hydrated before, during, and after your bowel prep for the prep to work effectively!    At 5:00 PM Begin the prep as follows:    1. Drink one bottle of premixed CLENPIQ right from the bottle. 2. Drink at least five (5) 8-ounce drinks of clear liquids of your choice within the next 5 hours   Continue clear liquids.    09/01/17  Day of Procedure     You may take TYLENOL products. Please continue your regular medications unless we have instructed you otherwise.    5 hours before procedure @ 4:30am:  1. Drink second bottle of premixed CLENPIQ right from the bottle.   2. Drink at least three (3) 8-ounce drinks of clear liquids of your choice within the next 2 hours. You can drink more if needed.   3 hours before your procedure time @ 6:30 am: Stop drinking all liquids, nothing by mouth at this point.  Please note, on the day of your procedure you MUST be accompanied by an adult who is willing to assume responsibility for you at time of discharge. If you do not have such person with you, your procedure will have to be rescheduled.   Please leave ALL jewelry at home prior to coming to the hospital for your procedure.   *It is your responsibility to check with your insurance company for the benefits of coverage you have for this procedure. Unfortunately, not all insurance companies have benefits to cover all or part of these types of procedures. It is your responsibility to check your benefits, however we will be glad to assist you with any codes your insurance company may need.   Please note that most insurance companies will not cover a screening colonoscopy for people under the age of 71  For example, with some insurance companies you may have benefits for a screening colonoscopy, but if polyps are found the diagnosis will change and then you may have a deductible that will need to be met. Please make sure you check your benefits for screening colonoscopy as well as a diagnostic colonoscopy.   CLEAR LIQUIDS: (NO RED or PURPLE) Water  Jello   Apple Juice  White Grape Juice   Kool-Aid Soft drinks  Banana popsicles Sports Drink  Black coffee (No cream or milk) Tea (No cream or milk)  Broth (fat free beef/chicken/vegetable)  Clear liquids allow you to see your fingers on the other side of the glass.  Be sure they are NOT RED or PURPLE in color, cloudy, but CLEAR.  Do Not Eat: Dairy products of any kind Cranberry juice Tomato or V8 Juice  Orange Juice  Grapefruit Juice Red Grape Juice Alcohol   Non-dairy creamer Solid foods like cereal, oatmeal, yogurt, fruits, vegetables, creamed soups, eggs, bread, etc   HELPFUL HINTS TO MAKE DRINKING EASIER: -Trying drinking through a straw. -If you become nauseated, try consuming smaller amounts or stretch out the time between glasses.  Stop for 30 minutes & slowly start back drinking.  Call our office with any questions or concerns at [phone].  Thank You

## 2017-08-28 NOTE — Progress Notes (Addendum)
Gastroenterology Pre-Procedure Review  Request Date:08/28/17 Requesting Physician: per [facility] ok to triage for 5 year tcs instead of 10 year tcs. See phone note.   PATIENT REVIEW QUESTIONS: The patient responded to the following health history questions as indicated:    1. Diabetes Melitis: no 2. Joint replacements in the past 12 months: no 3. Major health problems in the past 3 months: no 4. Has an artificial valve or MVP: no 5. Has a defibrillator: no 6. Has been advised in past to take antibiotics in advance of a procedure like teeth cleaning: no 7. Family history of colon cancer: yes (mother age 80)  40. Alcohol Use: no 9. History of sleep apnea: no  10. History of coronary artery or other vascular stents placed within the last 12 months: no 11. History of any prior anesthesia complications: no    MEDICATIONS & ALLERGIES:    Patient reports the following regarding taking any blood thinners:   Plavix? no Aspirin? no Coumadin? no Brilinta? no Xarelto? no Eliquis? no Pradaxa? no Savaysa? no Effient? no  Patient confirms/reports the following medications:  Current Outpatient Medications  Medication Sig Dispense Refill  . fluticasone (FLONASE) 50 MCG/ACT nasal spray 1 spray each nares twice a day (Patient taking differently: Place 1 spray into both nostrils daily as needed for allergies. 1 spray each nares twice a day) 10 g 1  . lisinopril-hydrochlorothiazide (PRINZIDE,ZESTORETIC) 20-25 MG per tablet Take 1 tablet by mouth daily.     . meloxicam (MOBIC) 15 MG tablet Take 15 mg by mouth daily.    . Pseudoephedrine-Ibuprofen (ADVIL COLD/SINUS PO) Take 2 tablets by mouth daily as needed (sinus headache).     No current facility-administered medications for this visit.     Patient confirms/reports the following allergies:  Allergies  Allergen Reactions  . Codeine Nausea And Vomiting    No orders of the defined types were placed in this encounter.   AUTHORIZATION  INFORMATION Primary Insurance: [place],  [place] #: [id number],  Group #: [id number] Pre-Cert / Auth required: no per MES  Ref # [id number]   SCHEDULE INFORMATION: Procedure has been scheduled as follows:  Date: 09/01/17, Time: 9:30 Location: [facility] [address]  This Gastroenterology Pre-Precedure Review Form is being routed to the following provider(s): Roseanne Kaufman NP

## 2017-08-29 ENCOUNTER — Ambulatory Visit: Payer: Commercial Managed Care - PPO

## 2017-08-29 NOTE — Progress Notes (Signed)
Appropriate.

## 2017-08-29 NOTE — Progress Notes (Signed)
Per Dr. Oneida Alar: needs Phenergan 12.5 mg IV in pre-op.

## 2017-08-31 ENCOUNTER — Telehealth: Payer: Self-pay

## 2017-08-31 NOTE — Progress Notes (Addendum)
Sarah from [facility] called back and said the procedure did need approval and the PA # is [phone] and is good 09/01/17-10/01/17.   Phenergan added to the pts orders.

## 2017-08-31 NOTE — Telephone Encounter (Signed)
Pt is returning Julies call in reference to her procedure tomorrow 09/01/17. Before 5pm call [phone], after 5pm call [phone]

## 2017-08-31 NOTE — Addendum Note (Signed)
Addended by: Claudina Lick on: 08/31/2017 11:14 AM   Modules accepted: Orders

## 2017-08-31 NOTE — Telephone Encounter (Signed)
I called and spoke with the pt, she had a couple of questions about her procedure tomorrow. I have answered all her questions.

## 2017-09-01 ENCOUNTER — Ambulatory Visit (HOSPITAL_COMMUNITY)
Admission: RE | Admit: 2017-09-01 | Discharge: 2017-09-01 | Disposition: A | Discharge status: Home / Self Care | Payer: Commercial Managed Care - PPO | Source: Ambulatory Visit | Attending: Gastroenterology | Admitting: Gastroenterology

## 2017-09-01 ENCOUNTER — Encounter (HOSPITAL_COMMUNITY): Payer: Self-pay | Admitting: *Deleted

## 2017-09-01 ENCOUNTER — Other Ambulatory Visit: Payer: Self-pay

## 2017-09-01 ENCOUNTER — Encounter (HOSPITAL_COMMUNITY)
Admission: RE | Disposition: A | Discharge status: Home / Self Care | Payer: Self-pay | Source: Ambulatory Visit | Attending: Gastroenterology

## 2017-09-01 DIAGNOSIS — K648 Other hemorrhoids: Secondary | ICD-10-CM | POA: Diagnosis not present

## 2017-09-01 DIAGNOSIS — Z860100 Personal history of colon polyps, unspecified: Secondary | ICD-10-CM

## 2017-09-01 DIAGNOSIS — I10 Essential (primary) hypertension: Secondary | ICD-10-CM | POA: Diagnosis not present

## 2017-09-01 DIAGNOSIS — Z79899 Other long term (current) drug therapy: Secondary | ICD-10-CM | POA: Insufficient documentation

## 2017-09-01 DIAGNOSIS — Q438 Other specified congenital malformations of intestine: Secondary | ICD-10-CM | POA: Diagnosis not present

## 2017-09-01 DIAGNOSIS — D122 Benign neoplasm of ascending colon: Secondary | ICD-10-CM | POA: Diagnosis not present

## 2017-09-01 DIAGNOSIS — E7800 Pure hypercholesterolemia, unspecified: Secondary | ICD-10-CM | POA: Insufficient documentation

## 2017-09-01 DIAGNOSIS — Z87891 Personal history of nicotine dependence: Secondary | ICD-10-CM | POA: Insufficient documentation

## 2017-09-01 DIAGNOSIS — Z1211 Encounter for screening for malignant neoplasm of colon: Secondary | ICD-10-CM | POA: Insufficient documentation

## 2017-09-01 DIAGNOSIS — Z791 Long term (current) use of non-steroidal anti-inflammatories (NSAID): Secondary | ICD-10-CM | POA: Insufficient documentation

## 2017-09-01 DIAGNOSIS — K644 Residual hemorrhoidal skin tags: Secondary | ICD-10-CM | POA: Insufficient documentation

## 2017-09-01 DIAGNOSIS — Z7951 Long term (current) use of inhaled steroids: Secondary | ICD-10-CM | POA: Insufficient documentation

## 2017-09-01 DIAGNOSIS — Z800 Family history of malignant neoplasm of digestive organs: Secondary | ICD-10-CM | POA: Diagnosis not present

## 2017-09-01 HISTORY — PX: COLONOSCOPY: SHX5424

## 2017-09-01 SURGERY — COLONOSCOPY
Anesthesia: Moderate Sedation

## 2017-09-01 MED ORDER — PROMETHAZINE HCL 25 MG/ML IJ SOLN
INTRAMUSCULAR | Status: AC
  Filled 2017-09-01: qty 1

## 2017-09-01 MED ORDER — MEPERIDINE HCL 100 MG/ML IJ SOLN
INTRAMUSCULAR | Status: DC | PRN
  Administered 2017-09-01: 50 mg via INTRAVENOUS
  Administered 2017-09-01: 25 mg via INTRAVENOUS

## 2017-09-01 MED ORDER — MEPERIDINE HCL 100 MG/ML IJ SOLN
INTRAMUSCULAR | Status: AC
  Filled 2017-09-01: qty 2

## 2017-09-01 MED ORDER — SODIUM CHLORIDE 0.9% FLUSH
INTRAVENOUS | Status: AC
  Filled 2017-09-01: qty 10

## 2017-09-01 MED ORDER — SODIUM CHLORIDE 0.9 % IV SOLN
INTRAVENOUS | Status: DC
  Administered 2017-09-01: 09:00:00 via INTRAVENOUS

## 2017-09-01 MED ORDER — PROMETHAZINE HCL 25 MG/ML IJ SOLN
12.5000 mg | Freq: Once | INTRAMUSCULAR | Status: AC
  Administered 2017-09-01: 12.5 mg via INTRAVENOUS

## 2017-09-01 MED ORDER — MIDAZOLAM HCL 5 MG/5ML IJ SOLN
INTRAMUSCULAR | Status: AC
  Filled 2017-09-01: qty 10

## 2017-09-01 MED ORDER — STERILE WATER FOR IRRIGATION IR SOLN
Status: DC | PRN
  Administered 2017-09-01: 10:00:00

## 2017-09-01 MED ORDER — MIDAZOLAM HCL 5 MG/5ML IJ SOLN
INTRAMUSCULAR | Status: DC | PRN
  Administered 2017-09-01 (×2): 2 mg via INTRAVENOUS

## 2017-09-01 NOTE — H&P (Signed)
Primary Care Physician:  Caryl Bis, MD Primary Gastroenterologist:  Dr. Oneida Alar  Pre-Procedure History & Physical: HPI:  Sarah Bullock is a 57 y.o. female here for  PERSONAL HISTORY OF POLYPS.  Past Medical History:  Diagnosis Date  . Anemia   . Angina   . Anxiety    "they've said I've had some anxiety attacks"  . Arthritis   . Blood transfusion    "in the 1980's during OR"  . Dysrhythmia    irregular  . GERD (gastroesophageal reflux disease)   . Headache(784.0)   . Heart murmur    "when I was real little"  . Hypercholesterolemia   . Hypertension   . Pneumonia    "when I was little"  . Spastic colon     Past Surgical History:  Procedure Laterality Date  . ABDOMINAL HYSTERECTOMY  ~ 2002  . Barbie Banner OSTEOTOMY Left 06/24/2015   Procedure: Barbie Banner OSTEOTOMY LEFT FOOT;  Surgeon: Caprice Beaver, DPM;  Location: [facility];  Service: Podiatry;  Laterality: Left;  . APPENDECTOMY  [id number]  . BACK SURGERY  ~ 2004   "blown disc"  . bladder surgery    . BUNIONECTOMY Left 06/24/2015   Procedure: [place] BUNIONECTOMY LEFT FOOT;  Surgeon: Caprice Beaver, DPM;  Location: [facility];  Service: Podiatry;  Laterality: Left;  . BUNIONECTOMY Right 05/03/2017   Procedure: Lillard Anes WITH Barbie Banner OSTEOTOMY RIGHT FOOT;  Surgeon: Caprice Beaver, DPM;  Location: [facility];  Service: Podiatry;  Laterality: Right;  . CARDIAC CATHETERIZATION  02/22/11   no significant CAD  . CESAREAN SECTION    . CHOLECYSTECTOMY  ~ 1991  . COLONOSCOPY N/A 08/13/2012   Procedure: COLONOSCOPY;  Surgeon: Danie Binder, MD;  Location: [facility];  Service: Endoscopy;  Laterality: N/A;  8:30-moved to [place] to notify pt  . FLEXOR TENOTOMY Left 06/24/2015   Procedure: PERCUTANEOUS FLEXOR TENOTOMY OF THE THIRD TOE LEFT FOOT;  Surgeon: Caprice Beaver, DPM;  Location: [facility];  Service: Podiatry;  Laterality: Left;  . FOOT ARTHROPLASTY  ~ 2004   "put rod in left foot"  . HAMMER TOE SURGERY Left 06/24/2015   Procedure:  HAMMER TOE CORRECTION 2ND TOE LEFT FOOT WITH TENDON LENGTHENING;  Surgeon: Caprice Beaver, DPM;  Location: [facility];  Service: Podiatry;  Laterality: Left;  . HAMMER TOE SURGERY Right 05/03/2017   Procedure: HAMMER TOE REPAIR 2ND DIGIT RIGHT FOOT;  Surgeon: Caprice Beaver, DPM;  Location: [facility];  Service: Podiatry;  Laterality: Right;  . LEFT HEART CATHETERIZATION WITH CORONARY ANGIOGRAM N/A 02/22/2011   Procedure: LEFT HEART CATHETERIZATION WITH CORONARY ANGIOGRAM;  Surgeon: Burnell Blanks, MD;  Location: [facility] CATH LAB;  Service: Cardiovascular;  Laterality: N/A;  . STERIOD INJECTION Left 05/03/2017   Procedure: CORTICOSTEROID INJECTION LEFT FOOT;  Surgeon: Caprice Beaver, DPM;  Location: [facility];  Service: Podiatry;  Laterality: Left;  . WEIL OSTEOTOMY Left 06/24/2015   Procedure: WEIL OSTEOTOMY 2ND METATARSAL LEFT FOOT;  Surgeon: Caprice Beaver, DPM;  Location: [facility];  Service: Podiatry;  Laterality: Left;  . WEIL OSTEOTOMY Right 05/03/2017   Procedure: WEIL OSTEOTOMY RIGHT FOOT;  Surgeon: Caprice Beaver, DPM;  Location: [facility];  Service: Podiatry;  Laterality: Right;    Prior to Admission medications   Medication Sig Start Date End Date Taking? Authorizing Provider  lisinopril-hydrochlorothiazide (PRINZIDE,ZESTORETIC) 20-25 MG per tablet Take 1 tablet by mouth daily.    Yes [provider]  Pseudoephedrine-Ibuprofen (ADVIL COLD/SINUS) 30-200 MG TABS Take 2 tablets by mouth  daily as needed (sinus headache).    Yes [provider]  fluticasone (FLONASE) 50 MCG/ACT nasal spray 1 spray each nares twice a day Patient taking differently: Place 1 spray into both nostrils daily as needed for allergies. 1 spray each nares twice a day 01/06/15   Tanna Furry, MD  meloxicam (MOBIC) 15 MG tablet Take 15 mg by mouth daily.    [provider]    Allergies as of 08/28/2017 - Review Complete 08/28/2017  Allergen Reaction Noted  . Codeine Nausea And Vomiting  04/19/2017    Family History  Problem Relation Age of Onset  . Coronary artery disease Father        s/p CABG  . Cancer Father        prostate cancer  . Colon cancer Mother        age 42  . Hypertension Mother   . Colon polyps Neg Hx     Social History   Socioeconomic History  . Marital status: Married    Spouse name: Not on file  . Number of children: Not on file  . Years of education: Not on file  . Highest education level: Not on file  Occupational History    Employer: [employer]: [employer]  . Financial resource strain: Not on file  . Food insecurity:    Worry: Not on file    Inability: Not on file  . Transportation needs:    Medical: Not on file    Non-medical: Not on file  Tobacco Use  . Smoking status: Former Smoker    Packs/day: 1.00    Years: 20.00    Pack years: 20.00    Types: Cigarettes    Last attempt to quit: 03/16/2008    Years since quitting: 9.4  . Smokeless tobacco: Never Used  Substance and Sexual Activity  . Alcohol use: No    Comment: rarely  . Drug use: No  . Sexual activity: Not on file  Lifestyle  . Physical activity:    Days per week: Not on file    Minutes per session: Not on file  . Stress: Not on file  Relationships  . Social connections:    Talks on phone: Not on file    Gets together: Not on file    Attends religious service: Not on file    Active member of club or organization: Not on file    Attends meetings of clubs or organizations: Not on file    Relationship status: Not on file  . Intimate partner violence:    Fear of current or ex partner: Not on file    Emotionally abused: Not on file    Physically abused: Not on file    Forced sexual activity: Not on file  Other Topics Concern  . Not on file  Social History Narrative  . Not on file    Review of Systems: See HPI, otherwise negative ROS   Physical Exam: BP (!) 141/70   Pulse 61    Temp 98.4 F (36.9 C) (Oral)   Resp 17   Ht 5\' 2"  (1.575 m)   Wt 213 lb (96.6 kg)   SpO2 99%   BMI 38.96 kg/m  General:   Alert,  pleasant and cooperative in NAD Head:  Normocephalic and atraumatic. Neck:  Supple; Lungs:  Clear throughout to auscultation.    Heart:  Regular rate and rhythm. Abdomen:  Soft, nontender and  nondistended. Normal bowel sounds, without guarding, and without rebound.   Neurologic:  Alert and  oriented x4;  grossly normal neurologically.  Impression/Plan:     PERSONAL HISTORY OF POLYPS.  PLAN: 1. TCS TODAY DISCUSSED PROCEDURE, BENEFITS, & RISKS: < 1% chance of medication reaction, bleeding, perforation, or rupture of spleen/liver.

## 2017-09-01 NOTE — Op Note (Signed)
[facility] Patient Name: Sarah Bullock Procedure Date: 09/01/2017 9:22 AM MRN: [id number] Date of Birth: 15-Jan-1961 Attending MD: Barney Drain MD, MD CSN: [id number] Age: 57 Admit Type: Outpatient Procedure:                Colonoscopy WITH COLD SNARE POLYPECTOMY Indications:              Personal history of colonic polyps. MOTHER HAD                            COLON CANCER AFETR AGE 15. LAST TCS APR 2014: ONE                            SIMPLE ADENOMA REMOVED. Providers:                Barney Drain MD, MD, Janeece Riggers, RN, Randa Spike, Technician Referring MD:             Mitzie Na. Daniel MD, MD Medicines:                Midazolam 4 mg IV, Meperidine 75 mg IV,                            Promethazine 81.4 mg IV Complications:            No immediate complications. Estimated Blood Loss:     Estimated blood loss was minimal. Procedure:                Pre-Anesthesia Assessment:                           - Prior to the procedure, a History and Physical                            was performed, and patient medications and                            allergies were reviewed. The patient's tolerance of                            previous anesthesia was also reviewed. The risks                            and benefits of the procedure and the sedation                            options and risks were discussed with the patient.                            All questions were answered, and informed consent                            was obtained. Prior Anticoagulants: The patient has  taken no previous anticoagulant or antiplatelet                            agents. ASA Grade Assessment: II - A patient with                            mild systemic disease. After reviewing the risks                            and benefits, the patient was deemed in                            satisfactory condition to undergo the procedure.                             After obtaining informed consent, the colonoscope                            was passed under direct vision. Throughout the                            procedure, the patient's blood pressure, pulse, and                            oxygen saturations were monitored continuously. The                            EC-[id number] ([id number]) scope was introduced through                            the anus and advanced to the the cecum, identified                            by appendiceal orifice and ileocecal valve. The                            colonoscopy was somewhat difficult due to a                            tortuous colon. Successful completion of the                            procedure was aided by straightening and shortening                            the scope to obtain bowel loop reduction and                            COLOWRAP. The patient tolerated the procedure                            fairly well. The quality of the bowel preparation  was excellent. The ileocecal valve, appendiceal                            orifice, and rectum were photographed. Scope In: 9:42:36 AM Scope Out: 9:56:07 AM Scope Withdrawal Time: 0 hours 12 minutes 0 seconds  Total Procedure Duration: 0 hours 13 minutes 31 seconds  Findings:      A 6 mm polyp was found in the mid ascending colon. The polyp was       sessile. The polyp was removed with a cold snare. Resection and       retrieval were complete.      External and internal hemorrhoids were found during retroflexion. The       hemorrhoids were moderate.      The recto-sigmoid colon, sigmoid colon and descending colon were       moderately tortuous. Impression:               - One 6 mm polyp in the mid ascending colon,                            removed with a cold snare. Resected and retrieved.                           - External and internal hemorrhoids.                           - MODERATELY Tortuous LEFT  colon. Moderate Sedation:      Moderate (conscious) sedation was administered by the endoscopy nurse       and supervised by the endoscopist. The following parameters were       monitored: oxygen saturation, heart rate, blood pressure, and response       to care. Total physician intraservice time was 25 minutes. Recommendation:           - High fiber diet.                           - Continue present medications.                           - Await pathology results.                           - Repeat colonoscopy in 5-10 years for surveillance.                           - Patient has a contact number available for                            emergencies. The signs and symptoms of potential                            delayed complications were discussed with the                            patient. Return to normal activities tomorrow.  Written discharge instructions were provided to the                            patient. Procedure Code(s):        --- Professional ---                           [phone], Colonoscopy, flexible; with removal of                            tumor(s), polyp(s), or other lesion(s) by snare                            technique                           G0500, Moderate sedation services provided by the                            same physician or other qualified health care                            professional performing a gastrointestinal                            endoscopic service that sedation supports,                            requiring the presence of an independent trained                            observer to assist in the monitoring of the                            patient's level of consciousness and physiological                            status; initial 15 minutes of intra-service time;                            patient age 23 years or older (additional time may                            be reported with [phone], as  appropriate)                           [phone], Moderate sedation services provided by the                            same physician or other qualified health care                            professional performing the diagnostic or  therapeutic service that the sedation supports,                            requiring the presence of an independent trained                            observer to assist in the monitoring of the                            patient's level of consciousness and physiological                            status; each additional 15 minutes intraservice                            time (List separately in addition to code for                            primary service) Diagnosis Code(s):        --- Professional ---                           D12.2, Benign neoplasm of ascending colon                           K64.8, Other hemorrhoids                           Z86.010, Personal history of colonic polyps                           Q43.8, Other specified congenital malformations of                            intestine CPT copyright 2017 American Medical Association. All rights reserved. The codes documented in this report are preliminary and upon coder review may  be revised to meet current compliance requirements. Barney Drain, MD Barney Drain MD, MD 09/01/2017 10:07:09 AM This report has been signed electronically. Number of Addenda: 0

## 2017-09-01 NOTE — Discharge Instructions (Signed)
You had 1 polyp removed. You have internal and external hemorrhoids.   DRINK WATER TO KEEP YOUR URINE LIGHT YELLOW.  Obesity increases your RISK FOR COLON CANCER. CONTINUE YOUR WEIGHT LOSS EFFORTS.  WHILE I DO NOT WANT TO ALARM YOU, YOUR BODY MASS INDEX IS OVER 30 WHICH MEANS YOU ARE OBESE. OBESITY CAN ACTIVATE CANCER GENES. OBESITY IS ASSOCIATED WITH AN INCREASED RISK FOR CIRRHOSIS. A WEIGHT OF 160 LBS or less WILL GET YOUR BODY MASS INDEX(BMI) UNDER 30.  FOLLOW A HIGH FIBER DIET. AVOID ITEMS THAT CAUSE BLOATING & GAS. SEE INFO BELOW.  YOUR BIOPSY RESULTS WILL BE AVAILABLE IN 7 DAYS.   Next colonoscopy BETWEEN 2024 AND 2026.    Colonoscopy Care After Read the instructions outlined below and refer to this sheet in the next week. These discharge instructions provide you with general information on caring for yourself after you leave the hospital. While your treatment has been planned according to the most current medical practices available, unavoidable complications occasionally occur. If you have any problems or questions after discharge, call DR. Estoria Geary, [phone].  ACTIVITY  You may resume your regular activity, but move at a slower pace for the next 24 hours.   Take frequent rest periods for the next 24 hours.   Walking will help get rid of the air and reduce the bloated feeling in your belly (abdomen).   No driving for 24 hours (because of the medicine (anesthesia) used during the test).   You may shower.   Do not sign any important legal documents or operate any machinery for 24 hours (because of the anesthesia used during the test).    NUTRITION  Drink plenty of fluids.   You may resume your normal diet as instructed by your doctor.   Begin with a light meal and progress to your normal diet. Heavy or fried foods are harder to digest and may make you feel sick to your stomach (nauseated).   Avoid alcoholic beverages for 24 hours or as instructed.     MEDICATIONS  You may resume your normal medications.   WHAT YOU CAN EXPECT TODAY  Some feelings of bloating in the abdomen.   Passage of more gas than usual.   Spotting of blood in your stool or on the toilet paper  .  IF YOU HAD POLYPS REMOVED DURING THE COLONOSCOPY:  Eat a soft diet IF YOU HAVE NAUSEA, BLOATING, ABDOMINAL PAIN, OR VOMITING.    FINDING OUT THE RESULTS OF YOUR TEST Not all test results are available during your visit. DR. Oneida Alar WILL CALL YOU WITHIN 14 DAYS OF YOUR PROCEDUE WITH YOUR RESULTS. Do not assume everything is normal if you have not heard from DR. Eagan Shifflett, CALL HER OFFICE AT [phone].  SEEK IMMEDIATE MEDICAL ATTENTION AND CALL THE OFFICE: [phone] IF:  You have more than a spotting of blood in your stool.   Your belly is swollen (abdominal distention).   You are nauseated or vomiting.   You have a temperature over 101F.   You have abdominal pain or discomfort that is severe or gets worse throughout the day.   High-Fiber Diet A high-fiber diet changes your normal diet to include more whole grains, legumes, fruits, and vegetables. Changes in the diet involve replacing refined carbohydrates with unrefined foods. The calorie level of the diet is essentially unchanged. The Dietary Reference Intake (recommended amount) for adult males is 38 grams per day. For adult females, it is 25 grams per day. Pregnant and lactating women  should consume 28 grams of fiber per day. Fiber is the intact part of a plant that is not broken down during digestion. Functional fiber is fiber that has been isolated from the plant to provide a beneficial effect in the body. PURPOSE  Increase stool bulk.   Ease and regulate bowel movements.   Lower cholesterol.   REDUCE RISK OF COLON CANCER  INDICATIONS THAT YOU NEED MORE FIBER  Constipation and hemorrhoids.   Uncomplicated diverticulosis (intestine condition) and irritable bowel syndrome.   Weight  management.   As a protective measure against hardening of the arteries (atherosclerosis), diabetes, and cancer.   GUIDELINES FOR INCREASING FIBER IN THE DIET  Start adding fiber to the diet slowly. A gradual increase of about 5 more grams (2 slices of whole-wheat bread, 2 servings of most fruits or vegetables, or 1 bowl of high-fiber cereal) per day is best. Too rapid an increase in fiber may result in constipation, flatulence, and bloating.   Drink enough water and fluids to keep your urine clear or pale yellow. Water, juice, or caffeine-free drinks are recommended. Not drinking enough fluid may cause constipation.   Eat a variety of high-fiber foods rather than one type of fiber.   Try to increase your intake of fiber through using high-fiber foods rather than fiber pills or supplements that contain small amounts of fiber.   The goal is to change the types of food eaten. Do not supplement your present diet with high-fiber foods, but replace foods in your present diet.   INCLUDE A VARIETY OF FIBER SOURCES  Replace refined and processed grains with whole grains, canned fruits with fresh fruits, and incorporate other fiber sources. White rice, white breads, and most bakery goods contain little or no fiber.   Brown whole-grain rice, buckwheat oats, and many fruits and vegetables are all good sources of fiber. These include: broccoli, Brussels sprouts, cabbage, cauliflower, beets, sweet potatoes, white potatoes (skin on), carrots, tomatoes, eggplant, squash, berries, fresh fruits, and dried fruits.   Cereals appear to be the richest source of fiber. Cereal fiber is found in whole grains and bran. Bran is the fiber-rich outer coat of cereal grain, which is largely removed in refining. In whole-grain cereals, the bran remains. In breakfast cereals, the largest amount of fiber is found in those with "bran" in their names. The fiber content is sometimes indicated on the label.   You may need to  include additional fruits and vegetables each day.   In baking, for 1 cup white flour, you may use the following substitutions:   1 cup whole-wheat flour minus 2 tablespoons.   1/2 cup white flour plus 1/2 cup whole-wheat flour.   Polyps, Colon  A polyp is extra tissue that grows inside your body. Colon polyps grow in the large intestine. The large intestine, also called the colon, is part of your digestive system. It is a long, hollow tube at the end of your digestive tract where your body makes and stores stool. Most polyps are not dangerous. They are benign. This means they are not cancerous. But over time, some types of polyps can turn into cancer. Polyps that are smaller than a pea are usually not harmful. But larger polyps could someday become or may already be cancerous. To be safe, doctors remove all polyps and test them.   WHO GETS POLYPS? Anyone can get polyps, but certain people are more likely than others. You may have a greater chance of getting polyps if:  You are over 50.   You have had polyps before.   Someone in your family has had polyps.   Someone in your family has had cancer of the large intestine.   Find out if someone in your family has had polyps. You may also be more likely to get polyps if you:   Eat a lot of fatty foods   Smoke   Drink alcohol   Do not exercise  Eat too much   PREVENTION There is not one sure way to prevent polyps. You might be able to lower your risk of getting them if you:  Eat more fruits and vegetables and less fatty food.   Do not smoke.   Avoid alcohol.   Exercise every day.   Lose weight if you are overweight.   Eating more calcium and folate can also lower your risk of getting polyps. Some foods that are rich in calcium are milk, cheese, and broccoli. Some foods that are rich in folate are chickpeas, kidney beans, and spinach.   Hemorrhoids Hemorrhoids are dilated (enlarged) veins around the rectum. Sometimes clots  will form in the veins. This makes them swollen and painful. These are called thrombosed hemorrhoids. Causes of hemorrhoids include:  Constipation.   Straining to have a bowel movement.   HEAVY LIFTING  HOME CARE INSTRUCTIONS  Eat a well balanced diet and drink 6 to 8 glasses of water every day to avoid constipation. You may also use a bulk laxative.   Avoid straining to have bowel movements.   Keep anal area dry and clean.   Do not use a donut shaped pillow or sit on the toilet for long periods. This increases blood pooling and pain.   Move your bowels when your body has the urge; this will require less straining and will decrease pain and pressure.

## 2017-09-05 ENCOUNTER — Encounter (HOSPITAL_COMMUNITY): Payer: Self-pay | Admitting: Gastroenterology

## 2019-01-28 ENCOUNTER — Other Ambulatory Visit: Payer: Self-pay

## 2019-01-28 DIAGNOSIS — Z20822 Contact with and (suspected) exposure to covid-19: Secondary | ICD-10-CM

## 2019-01-29 LAB — NOVEL CORONAVIRUS, NAA: SARS-CoV-2, NAA: NOT DETECTED

## 2022-01-16 HISTORY — PX: TOTAL KNEE ARTHROPLASTY: SHX125

## 2022-07-25 ENCOUNTER — Telehealth: Payer: Self-pay | Admitting: *Deleted

## 2022-07-25 DIAGNOSIS — Z86010 Personal history of colonic polyps: Secondary | ICD-10-CM

## 2022-07-25 NOTE — Telephone Encounter (Signed)
  Procedure: Colonoscopy  Estimated body mass index is 38.96 kg/m as calculated from the following:   Height as of 09/01/17: 5\' 2"  (1.575 m).   Weight as of 09/01/17: 213 lb (96.6 kg).   Have you had a colonoscopy before?  09/01/17, Dr. Darrick Penna  Do you have family history of colon cancer?  Yes mother  Do you have a family history of polyps? yes  Previous colonoscopy with polyps removed? yes  Do you have a history colorectal cancer?   no  Are you diabetic?  no  Do you have a prosthetic or mechanical heart valve? no  Do you have a pacemaker/defibrillator?   no  Have you had endocarditis/atrial fibrillation?  no  Do you use supplemental oxygen/CPAP?  no  Have you had joint replacement within the last 12 months?  yes  Do you tend to be constipated or have to use laxatives?  no   Do you have history of alcohol use? If yes, how much and how often.  no  Do you have history or are you using drugs? If yes, what do are you  using?  no  Have you ever had a stroke/heart attack?  no  Have you ever had a heart or other vascular stent placed,?no  Do you take weight loss medication? no  female patients,: have you had a hysterectomy? yes                              are you post menopausal?  yes                              do you still have your menstrual cycle?     Date of last menstrual period?   Do you take any blood-thinning medications such as: (Plavix, aspirin, Coumadin, Aggrenox, Brilinta, Xarelto, Eliquis, Pradaxa, Savaysa or Effient)? no  If yes we need the name, milligram, dosage and who is prescribing doctor:               Current Outpatient Medications  Medication Sig Dispense Refill   lisinopril-hydrochlorothiazide (PRINZIDE,ZESTORETIC) 20-25 MG per tablet Take 1 tablet by mouth daily.      meloxicam (MOBIC) 15 MG tablet Take 15 mg by mouth daily.     No current facility-administered medications for this visit.    Allergies  Allergen Reactions   Codeine Nausea  And Vomiting

## 2022-08-05 MED ORDER — NA SULFATE-K SULFATE-MG SULF 17.5-3.13-1.6 GM/177ML PO SOLN: ORAL | 0 refills | Status: DC

## 2022-08-05 NOTE — Telephone Encounter (Signed)
Called pt. Scheduled for 5/31. Aware needs lab work prior. Wants to have done where she works. Lab orders will be mailed with instructions. Aware will get pre-op phone call prior to appt. Rx for prep sent to pharmacy

## 2022-08-05 NOTE — Addendum Note (Signed)
Addended by: Armstead Peaks on: 08/05/2022 09:40 AM   Modules accepted: Orders

## 2022-08-05 NOTE — Telephone Encounter (Signed)
PA submitted via [employer]. Case ID# [id number]

## 2022-08-05 NOTE — Telephone Encounter (Signed)
Ok to schedule. ASA 2. Will need BMET due to diuretic.

## 2022-08-08 ENCOUNTER — Encounter: Payer: Self-pay | Admitting: *Deleted

## 2022-08-09 NOTE — Telephone Encounter (Signed)
PA approved. Case ID:  [id number] DOS: 08/17/2022- 11/14/2022

## 2022-08-10 NOTE — Telephone Encounter (Signed)
Questionnaire from recall, no referral needed  

## 2022-09-14 ENCOUNTER — Encounter (HOSPITAL_COMMUNITY): Payer: Self-pay

## 2022-09-14 ENCOUNTER — Encounter (HOSPITAL_COMMUNITY)
Admission: RE | Admit: 2022-09-14 | Discharge: 2022-09-14 | Disposition: A | Discharge status: Home / Self Care | Payer: Commercial Managed Care - PPO | Source: Ambulatory Visit | Attending: Internal Medicine | Admitting: Internal Medicine

## 2022-09-16 ENCOUNTER — Ambulatory Visit (HOSPITAL_BASED_OUTPATIENT_CLINIC_OR_DEPARTMENT_OTHER): Payer: Commercial Managed Care - PPO | Admitting: Anesthesiology

## 2022-09-16 ENCOUNTER — Ambulatory Visit (HOSPITAL_COMMUNITY): Payer: Commercial Managed Care - PPO | Admitting: Anesthesiology

## 2022-09-16 ENCOUNTER — Ambulatory Visit (HOSPITAL_COMMUNITY)
Admission: RE | Admit: 2022-09-16 | Discharge: 2022-09-16 | Disposition: A | Discharge status: Home / Self Care | Payer: Commercial Managed Care - PPO | Attending: Internal Medicine | Admitting: Internal Medicine

## 2022-09-16 ENCOUNTER — Encounter (HOSPITAL_COMMUNITY)
Admission: RE | Disposition: A | Discharge status: Home / Self Care | Payer: Self-pay | Source: Home / Self Care | Attending: Internal Medicine

## 2022-09-16 ENCOUNTER — Encounter (HOSPITAL_COMMUNITY): Payer: Self-pay

## 2022-09-16 DIAGNOSIS — K219 Gastro-esophageal reflux disease without esophagitis: Secondary | ICD-10-CM | POA: Insufficient documentation

## 2022-09-16 DIAGNOSIS — E7800 Pure hypercholesterolemia, unspecified: Secondary | ICD-10-CM | POA: Diagnosis not present

## 2022-09-16 DIAGNOSIS — Z87891 Personal history of nicotine dependence: Secondary | ICD-10-CM

## 2022-09-16 DIAGNOSIS — D126 Benign neoplasm of colon, unspecified: Secondary | ICD-10-CM | POA: Diagnosis not present

## 2022-09-16 DIAGNOSIS — Z1211 Encounter for screening for malignant neoplasm of colon: Secondary | ICD-10-CM | POA: Diagnosis present

## 2022-09-16 DIAGNOSIS — K648 Other hemorrhoids: Secondary | ICD-10-CM | POA: Insufficient documentation

## 2022-09-16 DIAGNOSIS — I10 Essential (primary) hypertension: Secondary | ICD-10-CM

## 2022-09-16 DIAGNOSIS — Z86010 Personal history of colonic polyps: Secondary | ICD-10-CM | POA: Insufficient documentation

## 2022-09-16 DIAGNOSIS — F419 Anxiety disorder, unspecified: Secondary | ICD-10-CM | POA: Insufficient documentation

## 2022-09-16 DIAGNOSIS — D122 Benign neoplasm of ascending colon: Secondary | ICD-10-CM | POA: Diagnosis not present

## 2022-09-16 DIAGNOSIS — Z6836 Body mass index (BMI) 36.0-36.9, adult: Secondary | ICD-10-CM | POA: Diagnosis not present

## 2022-09-16 HISTORY — PX: POLYPECTOMY: SHX5525

## 2022-09-16 HISTORY — PX: COLONOSCOPY WITH PROPOFOL: SHX5780

## 2022-09-16 SURGERY — COLONOSCOPY WITH PROPOFOL
Anesthesia: General

## 2022-09-16 MED ORDER — LACTATED RINGERS IV SOLN: INTRAVENOUS | Status: DC

## 2022-09-16 MED ORDER — PROPOFOL 10 MG/ML IV BOLUS
INTRAVENOUS | Status: DC | PRN
  Administered 2022-09-16 (×2): 100 mg via INTRAVENOUS
  Administered 2022-09-16 (×3): 50 mg via INTRAVENOUS
  Administered 2022-09-16: 100 mg via INTRAVENOUS
  Administered 2022-09-16: 50 mg via INTRAVENOUS

## 2022-09-16 NOTE — Transfer of Care (Signed)
Immediate Anesthesia Transfer of Care Note  Patient: Sarah Bullock  Procedure(s) Performed: COLONOSCOPY WITH PROPOFOL POLYPECTOMY  Patient Location: Short Stay  Anesthesia Type:General  Level of Consciousness: drowsy  Airway & Oxygen Therapy: Patient Spontanous Breathing  Post-op Assessment: Report given to RN and Post -op Vital signs reviewed and stable  Post vital signs: Reviewed and stable  Last Vitals:  Vitals Value Taken Time  BP 111/47 09/16/22 0901  Temp 36.4 C 09/16/22 0901  Pulse 71 09/16/22 0901  Resp 20 09/16/22 0901  SpO2 96 % 09/16/22 0901    Last Pain:  Vitals:   09/16/22 0901  PainSc: 0-No pain      Patients Stated Pain Goal: 8 (09/16/22 0710)  Complications: No notable events documented.

## 2022-09-16 NOTE — Discharge Instructions (Addendum)
°  Colonoscopy °Discharge Instructions ° °Read the instructions outlined below and refer to this sheet in the next few weeks. These discharge instructions provide you with general information on caring for yourself after you leave the hospital. Your doctor may also give you specific instructions. While your treatment has been planned according to the most current medical practices available, unavoidable complications occasionally occur.  ° °ACTIVITY °You may resume your regular activity, but move at a slower pace for the next 24 hours.  °Take frequent rest periods for the next 24 hours.  °Walking will help get rid of the air and reduce the bloated feeling in your belly (abdomen).  °No driving for 24 hours (because of the medicine (anesthesia) used during the test).   °Do not sign any important legal documents or operate any machinery for 24 hours (because of the anesthesia used during the test).  °NUTRITION °Drink plenty of fluids.  °You may resume your normal diet as instructed by your doctor.  °Begin with a light meal and progress to your normal diet. Heavy or fried foods are harder to digest and may make you feel sick to your stomach (nauseated).  °Avoid alcoholic beverages for 24 hours or as instructed.  °MEDICATIONS °You may resume your normal medications unless your doctor tells you otherwise.  °WHAT YOU CAN EXPECT TODAY °Some feelings of bloating in the abdomen.  °Passage of more gas than usual.  °Spotting of blood in your stool or on the toilet paper.  °IF YOU HAD POLYPS REMOVED DURING THE COLONOSCOPY: °No aspirin products for 7 days or as instructed.  °No alcohol for 7 days or as instructed.  °Eat a soft diet for the next 24 hours.  °FINDING OUT THE RESULTS OF YOUR TEST °Not all test results are available during your visit. If your test results are not back during the visit, make an appointment with your caregiver to find out the results. Do not assume everything is normal if you have not heard from your  caregiver or the medical facility. It is important for you to follow up on all of your test results.  °SEEK IMMEDIATE MEDICAL ATTENTION IF: °You have more than a spotting of blood in your stool.  °Your belly is swollen (abdominal distention).  °You are nauseated or vomiting.  °You have a temperature over 101.  °You have abdominal pain or discomfort that is severe or gets worse throughout the day.  ° °Your colonoscopy revealed 3 polyp(s) which I removed successfully. Await pathology results, my office will contact you. I recommend repeating colonoscopy in 5 years for surveillance purposes. Otherwise follow up with GI as needed.  ° ° °I hope you have a great rest of your week! ° °Charles K. Carver, D.O. °Gastroenterology and Hepatology °[facility] ° °

## 2022-09-16 NOTE — Op Note (Signed)
[facility] Patient Name: Sarah Bullock Procedure Date: 09/16/2022 8:24 AM MRN: [id number] Date of Birth: 1960-07-17 Attending MD: Hennie Duos. Marletta Lor , [place], [id number] CSN: [id number] Age: 62 Admit Type: Outpatient Procedure:                Colonoscopy Indications:              Surveillance: Personal history of adenomatous                            polyps on last colonoscopy 5 years ago Providers:                Hennie Duos. Marletta Lor, DO, Jannett Celestine, RN, Angelica Ran,                            Crystal Page Referring MD:              Medicines:                See the Anesthesia note for documentation of the                            administered medications Complications:            No immediate complications. Estimated Blood Loss:     Estimated blood loss was minimal. Procedure:                Pre-Anesthesia Assessment:                           - The anesthesia plan was to use monitored                            anesthesia care (MAC).                           After obtaining informed consent, the colonoscope                            was passed under direct vision. Throughout the                            procedure, the patient's blood pressure, pulse, and                            oxygen saturations were monitored continuously. The                            [id number]-HQ190L ([id number]) scope was introduced through                            the anus and advanced to the the cecum, identified                            by appendiceal orifice and ileocecal valve. The                            colonoscopy was technically  difficult and complex                            due to a redundant colon and significant looping.                            Successful completion of the procedure was aided by                            applying abdominal pressure. The patient tolerated                            the procedure well. The quality of the bowel                            preparation was  evaluated using the BBPS [facility]                            Bowel Preparation Scale) with scores of: Right                            Colon = 3, Transverse Colon = 3 and Left Colon = 3                            (entire mucosa seen well with no residual staining,                            small fragments of stool or opaque liquid). The                            total BBPS score equals 9. Scope In: 8:33:47 AM Scope Out: 8:56:58 AM Scope Withdrawal Time: 0 hours 14 minutes 41 seconds  Total Procedure Duration: 0 hours 23 minutes 11 seconds  Findings:      Non-bleeding internal hemorrhoids were found during endoscopy.      Three sessile polyps were found in the ascending colon. The polyps were       4 to 8 mm in size. These polyps were removed with a cold snare.       Resection and retrieval were complete.      The exam was otherwise without abnormality. Impression:               - Non-bleeding internal hemorrhoids.                           - Three 4 to 8 mm polyps in the ascending colon,                            removed with a cold snare. Resected and retrieved.                           - The examination was otherwise normal. Moderate Sedation:      Per Anesthesia Care Recommendation:           - Patient has a contact  number available for                            emergencies. The signs and symptoms of potential                            delayed complications were discussed with the                            patient. Return to normal activities tomorrow.                            Written discharge instructions were provided to the                            patient.                           - Resume previous diet.                           - Continue present medications.                           - Await pathology results.                           - Repeat colonoscopy in 5 years for surveillance.                           - Return to GI clinic PRN. Procedure Code(s):         --- Professional ---                           [phone], Colonoscopy, flexible; with removal of                            tumor(s), polyp(s), or other lesion(s) by snare                            technique Diagnosis Code(s):        --- Professional ---                           Z86.010, Personal history of colonic polyps                           D12.2, Benign neoplasm of ascending colon                           K64.8, Other hemorrhoids CPT copyright 2022 [employer]. All rights reserved. The codes documented in this report are preliminary and upon coder review may  be revised to meet current compliance requirements. Hennie Duos. Marletta Lor, DO Hennie Duos. Marletta Lor, DO 09/16/2022 8:59:09 AM This report has been signed electronically. Number of Addenda: 0

## 2022-09-16 NOTE — Anesthesia Preprocedure Evaluation (Signed)
Anesthesia Evaluation  Patient identified by MRN, date of birth, ID band Patient awake    Reviewed: Allergy & Precautions, H&P , NPO status , Patient's Chart, lab work & pertinent test results, reviewed documented beta blocker date and time   Airway Mallampati: II  TM Distance: >3 FB Neck ROM: full    Dental no notable dental hx.    Pulmonary neg pulmonary ROS, shortness of breath, pneumonia, former smoker   Pulmonary exam normal breath sounds clear to auscultation       Cardiovascular Exercise Tolerance: Good hypertension, negative cardio ROS + dysrhythmias + Valvular Problems/Murmurs  Rhythm:regular Rate:Normal     Neuro/Psych  Headaches  Anxiety     negative neurological ROS  negative psych ROS   GI/Hepatic negative GI ROS, Neg liver ROS,GERD  ,,  Endo/Other    Morbid obesity  Renal/GU negative Renal ROS  negative genitourinary   Musculoskeletal   Abdominal   Peds  Hematology negative hematology ROS (+) Blood dyscrasia, anemia   Anesthesia Other Findings   Reproductive/Obstetrics negative OB ROS                             Anesthesia Physical Anesthesia Plan  ASA: 3  Anesthesia Plan: General   Post-op Pain Management:    Induction:   PONV Risk Score and Plan: Propofol infusion  Airway Management Planned:   Additional Equipment:   Intra-op Plan:   Post-operative Plan:   Informed Consent: I have reviewed the patients History and Physical, chart, labs and discussed the procedure including the risks, benefits and alternatives for the proposed anesthesia with the patient or authorized representative who has indicated his/her understanding and acceptance.     Dental Advisory Given  Plan Discussed with: CRNA  Anesthesia Plan Comments:        Anesthesia Quick Evaluation

## 2022-09-16 NOTE — H&P (Signed)
Primary Care Physician:  Richardean Chimera, MD Primary Gastroenterologist:  Dr. Marletta Lor  Pre-Procedure History & Physical: HPI:  Sarah Bullock is a 62 y.o. female is here for a colonoscopy to be performed for surveillance purposes, personal history of adenomatous colon polyps in 2019 and family history of colon cancer in mother.   Past Medical History:  Diagnosis Date   Anemia    Anxiety    "they've said I've had some anxiety attacks"   Arthritis    Blood transfusion    "in the 1980's during OR"   Dysrhythmia    irregular   GERD (gastroesophageal reflux disease)    Headache(784.0)    Heart murmur    "when I was real little"   Hypercholesterolemia    Hypertension    Pneumonia    "when I was little"   Spastic colon     Past Surgical History:  Procedure Laterality Date   ABDOMINAL HYSTERECTOMY  ~ 2002   AIKEN OSTEOTOMY Left 06/24/2015   Procedure: Quintella Reichert OSTEOTOMY LEFT FOOT;  Surgeon: Ferman Hamming, DPM;  Location: [facility];  Service: Podiatry;  Laterality: Left;   APPENDECTOMY  ~1982   BACK SURGERY  ~ 2004   "blown disc"   bladder surgery     BUNIONECTOMY Left 06/24/2015   Procedure: AUSTIN BUNIONECTOMY LEFT FOOT;  Surgeon: Ferman Hamming, DPM;  Location: [facility];  Service: Podiatry;  Laterality: Left;   BUNIONECTOMY Right 05/03/2017   Procedure: Arbutus Leas WITH Quintella Reichert OSTEOTOMY RIGHT FOOT;  Surgeon: Ferman Hamming, DPM;  Location: [facility];  Service: Podiatry;  Laterality: Right;   CARDIAC CATHETERIZATION  02/22/2011   no significant CAD   CESAREAN SECTION     CHOLECYSTECTOMY  ~ 1991   COLONOSCOPY N/A 08/13/2012   Procedure: COLONOSCOPY;  Surgeon: West Bali, MD;  [facility];  Service: Endoscopy;  Laterality: N/A;  8:30-moved to [address] to notify pt   COLONOSCOPY N/A 09/01/2017   Procedure: COLONOSCOPY;  Surgeon: West Bali, MD;  [facility];  Service: Endoscopy;  Laterality: N/A;  9:30   FLEXOR TENOTOMY  Left 06/24/2015    Procedure: PERCUTANEOUS FLEXOR TENOTOMY OF THE THIRD TOE LEFT FOOT;  Surgeon: Ferman Hamming, DPM;  Location: [facility];  Service: Podiatry;  Laterality: Left;   FOOT ARTHROPLASTY  ~ 2004   "put rod in left foot"   HAMMER TOE SURGERY Left 06/24/2015   Procedure: HAMMER TOE CORRECTION 2ND TOE LEFT FOOT WITH TENDON LENGTHENING;  Surgeon: Ferman Hamming, DPM;  [facility]: [facility];  Service: Podiatry;  Laterality: Left;   HAMMER TOE SURGERY Right 05/03/2017   Procedure: HAMMER TOE REPAIR 2ND DIGIT RIGHT FOOT;  Surgeon: Ferman Hamming, DPM;  [facility]: [facility];  Service: Podiatry;  Laterality: Right;   LEFT HEART CATHETERIZATION WITH CORONARY ANGIOGRAM N/A 02/22/2011   Procedure: LEFT HEART CATHETERIZATION WITH CORONARY ANGIOGRAM;  Surgeon: Kathleene Hazel, MD;  Location: [facility] CATH LAB;  Service: Cardiovascular;  Laterality: N/A;   STERIOD INJECTION Left 05/03/2017   Procedure: CORTICOSTEROID INJECTION LEFT FOOT;  Surgeon: Ferman Hamming, DPM;  [facility]: [facility];  Service: Podiatry;  Laterality: Left;   TOTAL KNEE ARTHROPLASTY Right 01/2022   WEIL OSTEOTOMY Left 06/24/2015   Procedure: WEIL OSTEOTOMY 2ND METATARSAL LEFT FOOT;  Surgeon: Ferman Hamming, DPM;  [facility]: [facility];  Service: Podiatry;  Laterality: Left;   WEIL OSTEOTOMY Right 05/03/2017   Procedure: WEIL OSTEOTOMY RIGHT FOOT;  Surgeon: Ferman Hamming, DPM;  [facility]: [facility];  Service: Podiatry;  Laterality: Right;    Prior to Admission medications   Medication Sig Start Date End Date Taking? Authorizing Provider  acetaminophen (TYLENOL) 500 MG tablet Take 1,000 mg by mouth every 6 (six) hours as needed (pain.).   Yes [provider]  CELEBREX 100 MG capsule Take 100 mg by mouth daily as needed (stiffness/inflammation.). 07/29/22  Yes [provider]  ibuprofen (ADVIL) 200 MG tablet Take 800 mg by mouth every 8 (eight) hours as needed (pain.).   Yes [provider]  lisinopril-hydrochlorothiazide  (PRINZIDE,ZESTORETIC) 20-25 MG per tablet Take 1 tablet by mouth in the morning.   Yes [provider]  Na Sulfate-K Sulfate-Mg Sulf 17.5-3.13-1.6 GM/177ML SOLN As directed 08/05/22  Yes Georganne Siple, Hennie Duos, DO  amoxicillin (AMOXIL) 500 MG capsule Take 2,000 mg by mouth See admin instructions. Take 4 capsules (2000 mg) by mouth 1 hour prior to dental appointments. 07/20/22   [provider]    Allergies as of 08/05/2022 - Review Complete 09/01/2017  Allergen Reaction Noted   Codeine Nausea And Vomiting 04/19/2017    Family History  Problem Relation Age of Onset   Coronary artery disease Father        s/p CABG   Cancer Father        prostate cancer   Colon cancer Mother        age 68   Hypertension Mother    Colon polyps Neg Hx     Social History   Socioeconomic History   Marital status: Married    Spouse name: Not on file   Number of children: Not on file   Years of education: Not on file   Highest education level: Not on file  Occupational History    Employer: [employer]     Comment: [employer]  Tobacco Use   Smoking status: Former    Packs/day: 1.00    Years: 20.00    Additional pack years: 0.00    Total pack years: 20.00    Types: Cigarettes    Quit date: 03/16/2008    Years since quitting: 14.5   Smokeless tobacco: Never  Substance and Sexual Activity   Alcohol use: No    Comment: rarely   Drug use: No   Sexual activity: Not on file  Other Topics Concern   Not on file  Social History Narrative   Not on file   Social Determinants of Health   Financial Resource Strain: Not on file  Food Insecurity: Not on file  Transportation Needs: Not on file  Physical Activity: Not on file  Stress: Not on file  Social Connections: Not on file  Intimate Partner Violence: Not on file    Review of Systems: See HPI, otherwise negative ROS  Physical Exam: Vital signs in last 24 hours: Pulse Rate:  [64]  64 (05/31 0710) Resp:  [18] 18 (05/31 0710) BP: (147)/(74) 147/74 (05/31 0710) SpO2:  [97 %] 97 % (05/31 0710)   General:   Alert,  Well-developed, well-nourished, pleasant and cooperative in NAD Head:  Normocephalic and atraumatic. Eyes:  Sclera clear, no icterus.   Conjunctiva pink. Ears:  Normal auditory acuity. Nose:  No deformity, discharge,  or lesions. Msk:  Symmetrical without gross deformities. Normal posture. Extremities:  Without clubbing or edema. Neurologic:  Alert and  oriented x4;  grossly normal neurologically. Skin:  Intact without significant lesions or rashes. Psych:  Alert and cooperative. Normal mood and affect.  Impression/Plan: JAYDALIS RESTA is here  for a colonoscopy to be performed for surveillance purposes, personal history of adenomatous colon polyps in 2019 and family history of colon cancer in mother.   The risks of the procedure including infection, bleed, or perforation as well as benefits, limitations, alternatives and imponderables have been reviewed with the patient. Questions have been answered. All parties agreeable.

## 2022-09-19 LAB — SURGICAL PATHOLOGY

## 2022-09-21 ENCOUNTER — Encounter (HOSPITAL_COMMUNITY): Payer: Self-pay | Admitting: Internal Medicine

## 2022-09-22 NOTE — Anesthesia Postprocedure Evaluation (Signed)
Anesthesia Post Note  Patient: Sarah Bullock  Procedure(s) Performed: COLONOSCOPY WITH PROPOFOL POLYPECTOMY  Patient location during evaluation: Phase II Anesthesia Type: General Level of consciousness: awake Pain management: pain level controlled Vital Signs Assessment: post-procedure vital signs reviewed and stable Respiratory status: spontaneous breathing and respiratory function stable Cardiovascular status: blood pressure returned to baseline and stable Postop Assessment: no headache and no apparent nausea or vomiting Anesthetic complications: no Comments: Late entry   No notable events documented.   Last Vitals:  Vitals:   09/16/22 0710 09/16/22 0901  BP: (!) 147/74 (!) 111/47  Pulse: 64 71  Resp: 18 20  Temp:  (!) 36.4 C  SpO2: 97% 96%    Last Pain:  Vitals:   09/16/22 0901  PainSc: 0-No pain                 Windell Norfolk

## 2024-03-25 ENCOUNTER — Encounter: Payer: Self-pay | Admitting: Gastroenterology

## 2024-04-02 ENCOUNTER — Ambulatory Visit: Admitting: Gastroenterology

## 2024-04-02 ENCOUNTER — Encounter: Payer: Self-pay | Admitting: Gastroenterology

## 2024-04-02 ENCOUNTER — Encounter: Payer: Self-pay | Admitting: *Deleted

## 2024-04-02 ENCOUNTER — Telehealth: Payer: Self-pay | Admitting: *Deleted

## 2024-04-02 VITALS — BP 136/79 | HR 75 | Temp 97.7°F | Ht 62.0 in | Wt 184.6 lb

## 2024-04-02 NOTE — Patient Instructions (Addendum)
 We will get you scheduled for upper endoscopy with dilation with Dr. Cindie in the near future.   Please start taking the omeprazole  20 mg once daily, 30 minutes prior to breakfast and monitor for any recurrent symptoms.  With eating please do make sure you are taking small bites and alternating with sips of liquids as well and attempt to eat slower than usual in case you have recurrent symptoms.  Follow a GERD diet:  Avoid fried, fatty, greasy, spicy, citrus foods. Avoid caffeine and carbonated beverages. Avoid chocolate. Try eating 4-6 small meals a day rather than 3 large meals. Do not eat within 3 hours of laying down. Prop head of bed up on wood or bricks to create a 6 inch incline.  Follow-up postprocedure.  It was a pleasure to see you today. I want to create trusting relationships with patients. If you receive a survey regarding your visit,  I greatly appreciate you taking time to fill this out on paper or through your MyChart. I value your feedback.  Charmaine Melia, MSN, FNP-BC, AGACNP-BC [facility]

## 2024-04-02 NOTE — Telephone Encounter (Signed)
 [facility] PA for EGD/ED: Order ID: [id number]       Authorized  Approval Valid Through: 04/02/2024 - 06/30/2024

## 2024-04-02 NOTE — Progress Notes (Unsigned)
 GI Office Note    Referring Provider: Toribio Jerel MATSU, MD Primary Care Physician:  Toribio Jerel MATSU, MD Primary Gastroenterologist: Carlin POUR. Cindie, DO  Date:  04/02/2024  ID:  Deane JONELLE Sans, DOB January 14, 1961, MRN [id number]   Chief Complaint   Chief Complaint  Patient presents with   Dysphagia    When she eats food stops in her chest and sends a sharp pain to the left side of her chest    History of Present Illness  Sarah Bullock is a 63 y.o. female with a history of adenomatous colon polyps, anxiety, GERD, HLD, HTN, and anemia presenting today with complaint of dysphagia, and chest pain with meals.  Colonoscopy April 2014: - 5 small polyps removed, sessile ranging 2-4 mm in the ascending colon and rectum - Small internal hemorrhoids - Tubular adenomas and hyperplastic polyps and fragments.  Colonoscopy May 2019: - One 6 mm polyp in the mid ascending colon, removed with a cold snare. Resected and retrieved. - External and internal hemorrhoids.  - MODERATELY Tortuous LEFT colon. - Pathology: Simple adenoma, recommended colonoscopy between 2024-2026. - Repeat colonoscopy in 5-10 years  Colonoscopy May 2024: - Non- bleeding internal hemorrhoids.  - Three 4 to 8 mm polyps in the ascending colon, removed with a cold snare. Resected and retrieved.  - The examination was otherwise normal. - Pathology: Tubular adenomas - Repeat colonoscopy in 5 years  Office received referral 03/21/2024 for dysphagia.  Medications are Wegovy and lisinopril -hydrochlorothiazide combination and meloxicam.  Had reportedly lost 34 pounds on Wegovy at the time of her last office visit with Dr. Toribio in early 2025.  In regards to family history stated that her father died at the age of 51 with esophageal cancer and mother passed away at 13 with colon cancer.  Office visit 03/20/2024 with Dr. Toribio patient reported trouble with swallowing and it hurts in her chest with very sharp pain that occurs with  solids and liquids but denied any vomiting and has to stand up to get relief and happens most days of the week.  Denied any heartburn, gained about 10 pounds recently.  She was advised to add omeprazole  20 mg over-the-counter daily for now and referred to GI.  Today:  Discussed the use of AI scribe software for clinical note transcription with the patient, who gave verbal consent to proceed.  She experiences sharp, mid-chest pain that occurs when eating a good-sized meal, radiating a couple of inches across her chest and making it difficult to breathe deeply. The sensation is described as 'somebody's in there with your knuckles just trying to push something through.' Initially, episodes lasted longer, requiring her to stand up, lean over, and take short breaths but recently not lasting as long. Burping sometimes alleviates the discomfort. Despite switching from fizzy drinks to water , symptoms persist. Recently, episodes have become more frequent and occur with smaller amounts of food. She also experiences sweating and a sensation of impending sickness during these episodes.  A particularly severe episode during a dinner outing frightened her daughter as she clinched her chest suddenly because the pain came on without warning, prompting the referral. The pain can last a couple of minutes and and is relieved by stopping eating and moving around. She experienced a milder episode without eating, after taking her medication with water , which she found unusual. She mentions numbness in two fingers, which she reports has been present for a long time.  Her father had cancer, and her mother had  colon cancer, which concerns her family. 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      Wt Readings from Last 6 Encounters:  04/02/24 184 lb 9.6 oz (83.7 kg)  09/14/22 206 lb (93.4 kg)  09/01/17 213 lb (96.6 kg)  04/25/17 210 lb (95.3 kg)  06/22/15 200 lb (90.7 kg)  01/06/15 196 lb (88.9 kg)     Body mass index is 33.76 kg/m.   Current Outpatient Medications  Medication Sig Dispense Refill   acetaminophen  (TYLENOL ) 500 MG tablet Take 1,000 mg by mouth every 6 (six) hours as needed (pain.).     ibuprofen (ADVIL) 200 MG tablet Take 800 mg by mouth every 8 (eight) hours as needed (pain.).     lisinopril -hydrochlorothiazide (PRINZIDE,ZESTORETIC) 20-25 MG per tablet Take 1 tablet by mouth in the morning.     meloxicam (MOBIC) 15 MG tablet Take 15 mg by mouth daily. (Patient taking differently: Take 15 mg by mouth daily. PRN)     No current facility-administered medications for this visit.    Past Medical History:  Diagnosis Date   Anemia    Anxiety    they've said I've had some anxiety attacks   Arthritis    Blood transfusion    in the 1980's during OR   Dysrhythmia    irregular   GERD (gastroesophageal reflux disease)    Headache(784.0)    Heart murmur    when I was real little   Hypercholesterolemia    Hypertension    Pneumonia    when I was little   Spastic colon     Past Surgical History:  Procedure Laterality Date   ABDOMINAL HYSTERECTOMY  ~ 2002   AIKEN OSTEOTOMY Left 06/24/2015   Procedure: KATRINA OSTEOTOMY LEFT FOOT;  Surgeon: Morene Anon, DPM;  Location: [facility];  Service: Podiatry;  Laterality: Left;   APPENDECTOMY  ~1982   BACK SURGERY  ~ 2004   blown disc   bladder surgery     BUNIONECTOMY Left 06/24/2015   Procedure: AUSTIN BUNIONECTOMY LEFT FOOT;  Surgeon: Morene Anon, DPM;  Location: [facility];  Service: Podiatry;  Laterality: Left;   BUNIONECTOMY Right 05/03/2017   Procedure: ROMAYNE WITH KATRINA OSTEOTOMY RIGHT FOOT;  Surgeon: Anon Morene, DPM;  Location: [facility];  Service: Podiatry;  Laterality: Right;   CARDIAC CATHETERIZATION  02/22/2011   no significant CAD   CESAREAN SECTION     CHOLECYSTECTOMY  ~ 1991   COLONOSCOPY N/A 08/13/2012   Procedure: COLONOSCOPY;  Surgeon: Margo LITTIE Haddock, MD;  [facility];  Service: Endoscopy;  Laterality: N/A;  8:30-moved to [address] to notify pt   COLONOSCOPY N/A 09/01/2017   Procedure: COLONOSCOPY;  Surgeon: Haddock Margo LITTIE, MD;  [facility];  Service: Endoscopy;  Laterality: N/A;  9:30   COLONOSCOPY WITH PROPOFOL  N/A 09/16/2022   Procedure: COLONOSCOPY WITH PROPOFOL ;  Surgeon: Cindie Carlin POUR, DO;  [facility];  Service: Endoscopy;  Laterality: N/A;  845am, asa 2   FLEXOR TENOTOMY  Left 06/24/2015   Procedure: PERCUTANEOUS FLEXOR TENOTOMY OF THE THIRD TOE LEFT FOOT;  Surgeon: Morene Anon, DPM;  Location: [facility];  Service: Podiatry;  Laterality: Left;   FOOT ARTHROPLASTY  ~ 2004   put rod in left foot   HAMMER TOE SURGERY Left 06/24/2015   Procedure: HAMMER TOE CORRECTION 2ND TOE LEFT FOOT WITH TENDON LENGTHENING;  Surgeon: Morene Anon, DPM;  Location: [facility];  Service: Podiatry;  Laterality: Left;   HAMMER TOE SURGERY Right 05/03/2017   Procedure: HAMMER TOE REPAIR 2ND DIGIT RIGHT FOOT;  Surgeon: Blinda Katz, DPM;  Location: [facility];  Service: Podiatry;  Laterality: Right;   LEFT HEART CATHETERIZATION WITH CORONARY ANGIOGRAM N/A 02/22/2011   Procedure: LEFT HEART CATHETERIZATION WITH CORONARY ANGIOGRAM;  Surgeon: Lonni JONETTA Cash, MD;  Location: [facility] CATH LAB;  Service: Cardiovascular;  Laterality: N/A;   POLYPECTOMY  09/16/2022   Procedure: POLYPECTOMY;  Surgeon: Cindie Carlin POUR, DO;  [facility];  Service: Endoscopy;;   STERIOD INJECTION Left 05/03/2017   Procedure: CORTICOSTEROID INJECTION LEFT FOOT;  Surgeon: Blinda Katz, DPM;  Location: [facility];  Service: Podiatry;  Laterality: Left;   TOTAL KNEE ARTHROPLASTY Right 01/2022   WEIL OSTEOTOMY Left 06/24/2015   Procedure: WEIL OSTEOTOMY 2ND METATARSAL LEFT FOOT;  Surgeon: Katz Blinda, DPM;  Location: [facility];  Service: Podiatry;  Laterality: Left;   WEIL OSTEOTOMY Right 05/03/2017   Procedure: WEIL OSTEOTOMY RIGHT FOOT;   Surgeon: Blinda Katz, DPM;  Location: [facility];  Service: Podiatry;  Laterality: Right;    Family History  Problem Relation Age of Onset   Coronary artery disease Father        s/p CABG   Cancer Father        prostate cancer   Colon cancer Mother        age 39   Hypertension Mother    Colon polyps Neg Hx     Allergies as of 04/02/2024 - Review Complete 04/02/2024  Allergen Reaction Noted   Codeine Nausea And Vomiting 04/19/2017    Social History   Socioeconomic History   Marital status: Married    Spouse name: Not on file   Number of children: Not on file   Years of education: Not on file   Highest education level: Not on file  Occupational History    Employer: [employer]     Comment: [employer]  Tobacco Use   Smoking status: Former    Current packs/day: 0.00    Average packs/day: 1 pack/day for 20.0 years (20.0 ttl pk-yrs)    Types: Cigarettes    Start date: 03/16/1988    Quit date: 03/16/2008    Years since quitting: 16.0   Smokeless tobacco: Never  Substance and Sexual Activity   Alcohol use: No    Comment: rarely   Drug use: No   Sexual activity: Not on file  Other Topics Concern   Not on file  Social History Narrative   Not on file   Social Drivers of Health   Tobacco Use: Medium Risk (04/02/2024)   Patient History    Smoking Tobacco Use: Former    Smokeless Tobacco Use: Never    Passive Exposure: Not on Actuary Strain: Not on file  Food Insecurity: Not on file  Transportation Needs: Not on file  Physical Activity: Not on file  Stress: Not on file  Social Connections: Not on file  Depression (EYV7-0): Not on file  Alcohol Screen: Not on file  Housing: Not on file  Utilities: Not on file  Health Literacy: Not on file    Review of Systems   Gen: Denies fever, chills, anorexia. Denies fatigue, weakness, weight loss.  CV: + chest discomfort. Denies palpitations, syncope,  peripheral edema, and claudication. Resp: Denies dyspnea at rest, cough, wheezing, coughing up blood, and pleurisy. GI: See HPI Derm: Denies rash, itching, dry skin Psych: Denies depression, anxiety, memory  loss, confusion. No homicidal or suicidal ideation.  Heme: Denies bruising, bleeding, and enlarged lymph nodes.  Physical Exam   BP 136/79 (BP Location: Right Arm, Patient Position: Sitting, Cuff Size: Large)   Pulse 75   Temp 97.7 F (36.5 C) (Temporal)   Ht 5' 2 (1.575 m)   Wt 184 lb 9.6 oz (83.7 kg)   BMI 33.76 kg/m   General:   Alert and oriented. No distress noted. Pleasant and cooperative.  Head:  Normocephalic and atraumatic. Eyes:  Conjuctiva clear without scleral icterus. Mouth:  Oral mucosa pink and moist. Good dentition. No lesions. Lungs:  Clear to auscultation bilaterally. No wheezes, rales, or rhonchi. No distress.  Heart:  S1, S2 present without murmurs appreciated.  Abdomen:  +BS, soft, non-tender and non-distended. No rebound or guarding. No HSM or masses noted. Rectal: Deferred Msk:  Symmetrical without gross deformities. Normal posture. Extremities:  Without edema. Neurologic:  Alert and  oriented x4 Psych:  Alert and cooperative. Normal mood and affect.  Assessment & Plan  RANDILYN FOISY is a 63 y.o. female presenting today with dysphagia.     Esophageal dysphagia with chest pain Intermittent sharp chest pain during meals, localized to the mid-chest area, radiating slightly to the left. Symptoms include difficulty swallowing large meals, with pain onset after a few bites. Symptoms have increased in frequency and severity. Differential diagnosis includes esophageal spasms, esophagitis, gastritis, hiatal hernia, and potential esophageal narrowing (esophageal web/ring). Low suspicion for a mass based on current symptoms and history. Discussed potential for esophageal narrowing due to reflux or fibrotic tissue, and the possibility of esophageal spasms  contributing to symptoms. Explained that reflux can present in various ways and may not always include typical heartburn symptoms. - Start omeprazole  20 mg once daily. - Scheduled upper endoscopy with potential dilation if narrowing is found. - Provided information on esophageal manometry as a next step if EGD inconclusive or negative.   Gastroesophageal reflux disease Potential GERD contributing to esophageal dysphagia and chest pain. Symptoms may be due to acid reflux causing esophageal inflammation and spasms. Discussed that reflux can present in various ways and may not always include typical heartburn symptoms. Explained that treating acid reflux with omeprazole  may improve swallowing and reduce esophageal spasms. - Start omeprazole  20 mg once daily.  - GERD diet    Procedure:  Proceed with upper endoscopy with dilation with propofol  by Dr. Cindie in near future: the risks, benefits, and alternatives have been discussed with the patient in detail. The patient states understanding and desires to proceed.  ASA 2   Follow up   Follow up post procedure.    Charmaine Melia, MSN, FNP-BC, AGACNP-BC [facility]

## 2024-04-25 ENCOUNTER — Other Ambulatory Visit: Payer: Self-pay | Admitting: Gastroenterology

## 2024-04-25 MED ORDER — OMEPRAZOLE 40 MG PO CPDR: 40.0000 mg | DELAYED_RELEASE_CAPSULE | Freq: Every day | ORAL | 0 refills | Status: AC

## 2024-04-25 NOTE — Telephone Encounter (Signed)
 Pt is cancelling her procedure on 05/03/24 due to insurance reasons. She said that she received a quote for procedure of $16,000 and she said to double check and the price was correct. She says she just afford that at this time.  She wants to know if there is anything else that can be done. She has been taking the Prilosec, eating smaller portions. She is still having some stomach pain. Please advise. Thank you

## 2024-04-25 NOTE — Telephone Encounter (Signed)
 Advised pt of providers message and recommendations. She states that she would just like to increase the omeprazole  and see if that would help in her symptoms. She does not wish to schedule the BPE at this time. Please sent medication to [facility] in [place], [place].

## 2024-04-26 NOTE — Telephone Encounter (Signed)
 noted

## 2024-04-29 ENCOUNTER — Encounter (HOSPITAL_COMMUNITY)

## 2024-05-03 ENCOUNTER — Encounter (HOSPITAL_COMMUNITY): Admission: RE | Payer: Self-pay | Source: Home / Self Care

## 2024-05-03 ENCOUNTER — Ambulatory Visit (HOSPITAL_COMMUNITY): Admission: RE | Admit: 2024-05-03 | Source: Home / Self Care | Admitting: Internal Medicine

## 2024-05-03 SURGERY — EGD (ESOPHAGOGASTRODUODENOSCOPY)
Anesthesia: Choice
